# Patient Record
Sex: Female | Born: 1945 | Race: White | Hispanic: No | Marital: Married | Smoking: Never smoker
Health system: Southern US, Community
[De-identification: ages and names within clinical notes are randomized; demographics above are authoritative.]

## PROBLEM LIST (undated history)

## (undated) DIAGNOSIS — E7800 Pure hypercholesterolemia, unspecified: Secondary | ICD-10-CM

## (undated) DIAGNOSIS — I10 Essential (primary) hypertension: Secondary | ICD-10-CM

## (undated) DIAGNOSIS — M1990 Unspecified osteoarthritis, unspecified site: Secondary | ICD-10-CM

## (undated) DIAGNOSIS — S83209A Unspecified tear of unspecified meniscus, current injury, unspecified knee, initial encounter: Secondary | ICD-10-CM

## (undated) DIAGNOSIS — G500 Trigeminal neuralgia: Secondary | ICD-10-CM

## (undated) HISTORY — DX: Trigeminal neuralgia: G50.0

## (undated) HISTORY — DX: Pure hypercholesterolemia, unspecified: E78.00

## (undated) HISTORY — PX: COLONOSCOPY: SHX174

## (undated) HISTORY — DX: Unspecified osteoarthritis, unspecified site: M19.90

## (undated) HISTORY — PX: FOOT SURGERY: SHX648

## (undated) HISTORY — PX: MOUTH SURGERY: SHX715

## (undated) HISTORY — DX: Essential (primary) hypertension: I10

## (undated) HISTORY — PX: HERNIA REPAIR: SHX51

## (undated) HISTORY — DX: Unspecified tear of unspecified meniscus, current injury, unspecified knee, initial encounter: S83.209A

---

## 2002-11-14 ENCOUNTER — Ambulatory Visit (HOSPITAL_COMMUNITY): Admission: RE | Admit: 2002-11-14 | Discharge: 2002-11-14 | Payer: Self-pay | Admitting: General Surgery

## 2004-05-30 ENCOUNTER — Ambulatory Visit (HOSPITAL_COMMUNITY): Admission: RE | Admit: 2004-05-30 | Discharge: 2004-05-30 | Payer: Self-pay | Admitting: General Surgery

## 2004-08-10 ENCOUNTER — Ambulatory Visit (HOSPITAL_COMMUNITY): Admission: RE | Admit: 2004-08-10 | Discharge: 2004-08-10 | Payer: Self-pay | Admitting: Family Medicine

## 2005-02-10 ENCOUNTER — Ambulatory Visit (HOSPITAL_COMMUNITY): Admission: RE | Admit: 2005-02-10 | Discharge: 2005-02-10 | Payer: Self-pay | Admitting: Family Medicine

## 2005-06-23 ENCOUNTER — Ambulatory Visit (HOSPITAL_COMMUNITY): Admission: RE | Admit: 2005-06-23 | Discharge: 2005-06-23 | Payer: Self-pay | Admitting: General Surgery

## 2005-07-14 ENCOUNTER — Ambulatory Visit (HOSPITAL_COMMUNITY): Admission: RE | Admit: 2005-07-14 | Discharge: 2005-07-14 | Payer: Self-pay | Admitting: General Surgery

## 2005-07-24 ENCOUNTER — Encounter (HOSPITAL_COMMUNITY): Admission: RE | Admit: 2005-07-24 | Discharge: 2005-07-24 | Payer: Self-pay | Admitting: General Surgery

## 2006-02-12 ENCOUNTER — Ambulatory Visit (HOSPITAL_COMMUNITY): Admission: RE | Admit: 2006-02-12 | Discharge: 2006-02-12 | Payer: Self-pay | Admitting: Family Medicine

## 2006-04-03 ENCOUNTER — Ambulatory Visit (HOSPITAL_COMMUNITY): Admission: RE | Admit: 2006-04-03 | Discharge: 2006-04-03 | Payer: Self-pay | Admitting: Family Medicine

## 2006-09-19 ENCOUNTER — Ambulatory Visit (HOSPITAL_COMMUNITY): Admission: RE | Admit: 2006-09-19 | Discharge: 2006-09-19 | Payer: Self-pay | Admitting: Podiatry

## 2012-02-26 DIAGNOSIS — E785 Hyperlipidemia, unspecified: Secondary | ICD-10-CM | POA: Insufficient documentation

## 2014-03-26 DIAGNOSIS — M5430 Sciatica, unspecified side: Secondary | ICD-10-CM | POA: Insufficient documentation

## 2015-11-11 ENCOUNTER — Ambulatory Visit: Admit: 2015-11-11 | Payer: MEDICARE

## 2015-11-11 DIAGNOSIS — R519 Headache, unspecified: Secondary | ICD-10-CM

## 2015-11-11 NOTE — Unmapped (Signed)
PRN

## 2015-11-12 NOTE — Unmapped (Signed)
Paige Cross, 70 year old lady was seen in consultation today because of facial pain, left side, involving the upper maxillary region of 5 years duration.  The patient first developed dull, aching pain in the left upper jaw.  She was seen by a dentist and underwent several dental procedures.  She then underwent consultation at [facility] where they recommended a microvascular decompression, but she obtained additional consultations at [facility] at [facility] where both consultants recommended no surgical treatment.  The patient is healthy otherwise, but has had increasing depression and has tried numerous medications with no help.      PAST MEDICAL HISTORY:  Includes surgical procedures-  1. Herniorrhaphy.  2. Cesarean section.  3. 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  4. Multiple biopsies of breast nodules.     The remaining medical, family, social history and review of systems is included in her record.    PHYSICAL EXAMINATION:  Demonstrates a well-developed, healthy lady with blood pressure of 169/73, pulse 76 and BMI of 32.  She is cognitively intact and general physical examination shows by observation no evidence of pulmonary, cardiac, abdominal or extremity disorders.  The neurologic examination shows that the cranial nerves II through XII are intact.  She has no pain over touching the face or the area in the upper jaw where 2 teeth have been extracted and a crown has been placed over the molar anterior to the area of 2 edentulous members.  The patient has described her pain as constant, burning, aching in nature.  It never goes away unless she is distracted.  There is no loss of sensation in the area of the gum, face or anywhere on her body.  All cranial nerves are intact.  Coordination, reflexes, gait, strength and sensation of the body are intact.    The remaining neurologic examination is totally normal.    I reviewed an MRI who shows no evidence of tumor or vascular  compression.    We spent the remainder of the hour discussing nutrition, exercise and wellness, particularly meditation, spirituality and the impact of all of these disorders on her pain and recover.  She understands that a surgical procedure is inappropriate for her condition.  We discussed his in great detail and recommended discontinuation of Fentanyl and sleeping medications.    She will take these matters into consideration and give [place] a follow-up evaluation in 1 month.    Thank you for the opportunity to assist you in the care of his lady.  Best regards.    TIME SPENT:  I spent 1 hour face-to-face with the patient answering questions and discussing the diagnosis and treatment plan.  The patient voiced understanding and agreed with the plan.    JMT/nwc   [id number]

## 2015-11-16 ENCOUNTER — Encounter

## 2016-11-13 DIAGNOSIS — E118 Type 2 diabetes mellitus with unspecified complications: Secondary | ICD-10-CM | POA: Insufficient documentation

## 2016-11-13 DIAGNOSIS — K146 Glossodynia: Secondary | ICD-10-CM | POA: Insufficient documentation

## 2016-11-13 DIAGNOSIS — F419 Anxiety disorder, unspecified: Secondary | ICD-10-CM | POA: Insufficient documentation

## 2016-11-13 DIAGNOSIS — R208 Other disturbances of skin sensation: Secondary | ICD-10-CM | POA: Insufficient documentation

## 2018-01-22 DIAGNOSIS — G500 Trigeminal neuralgia: Secondary | ICD-10-CM | POA: Insufficient documentation

## 2019-05-20 ENCOUNTER — Ambulatory Visit (INDEPENDENT_AMBULATORY_CARE_PROVIDER_SITE_OTHER): Payer: Self-pay | Admitting: Internal Medicine

## 2019-05-28 ENCOUNTER — Encounter: Payer: Self-pay | Admitting: Orthopaedic Surgery

## 2019-05-28 ENCOUNTER — Ambulatory Visit (INDEPENDENT_AMBULATORY_CARE_PROVIDER_SITE_OTHER): Payer: Medicare Other | Admitting: Orthopaedic Surgery

## 2019-05-28 ENCOUNTER — Other Ambulatory Visit: Payer: Self-pay

## 2019-05-28 ENCOUNTER — Ambulatory Visit (INDEPENDENT_AMBULATORY_CARE_PROVIDER_SITE_OTHER): Payer: Medicare Other

## 2019-05-28 VITALS — BP 167/100 | HR 71 | Ht 59.0 in | Wt 137.0 lb

## 2019-05-28 DIAGNOSIS — M25561 Pain in right knee: Secondary | ICD-10-CM

## 2019-05-28 DIAGNOSIS — G8929 Other chronic pain: Secondary | ICD-10-CM | POA: Diagnosis not present

## 2019-05-28 NOTE — Progress Notes (Signed)
Subjective:    Patient ID: Wendy Barker, female    DOB: Feb 22, 1946, 73 y.o.   MRN: [id number]  HPI She has had pain increasing in the right knee since a fall about a month ago. Two years ago she had arthroscopy of the right knee and did well.  She fell about a year ago and had some pain for a short time.  Now she has pain and swelling of the right knee, no giving way.  It just does not feel right to her.  She has tried Tylenol, Advil, ice and rest.  She has no redness, no numbness.   Review of Systems  Constitutional: Positive for activity change.  Musculoskeletal: Positive for arthralgias, gait problem and joint swelling.  All other systems reviewed and are negative.  For Review of Systems, all other systems reviewed and are negative.  The following is a summary of the past history medically, past history surgically, known current medicines, social history and family history.  This information is gathered electronically by the computer from prior information and documentation.  I review this each visit and have found including this information at this point in the chart is beneficial and informative.   No past medical history on file.    No current outpatient medications on file prior to visit.   No current facility-administered medications on file prior to visit.     Social History   Socioeconomic History  . Marital status: Married    Spouse name: Not on file  . Number of children: Not on file  . Years of education: Not on file  . Highest education level: Not on file  Occupational History  . Not on file  Social Needs  . Financial resource strain: Not on file  . Food insecurity    Worry: Not on file    Inability: Not on file  . Transportation needs    Medical: Not on file    Non-medical: Not on file  Tobacco Use  . Smoking status: Never Smoker  . Smokeless tobacco: Never Used  Substance and Sexual Activity  . Alcohol use: Never    Frequency: Never  . Drug use:  Never  . Sexual activity: Not Currently  Lifestyle  . Physical activity    Days per week: Not on file    Minutes per session: Not on file  . Stress: Not on file  Relationships  . Social Herbalist on phone: Not on file    Gets together: Not on file    Attends religious service: Not on file    Active member of club or organization: Not on file    Attends meetings of clubs or organizations: Not on file    Relationship status: Not on file  . Intimate partner violence    Fear of current or ex partner: Not on file    Emotionally abused: Not on file    Physically abused: Not on file    Forced sexual activity: Not on file  Other Topics Concern  . Not on file  Social History Narrative  . Not on file    History of heart disease in the family.   BP (!) 167/100   Pulse 71   Ht 4\' 11"  (1.499 m)   Wt 137 lb (62.1 kg)   BMI 27.67 kg/m   Body mass index is 27.67 kg/m.     Objective:   Physical Exam Vitals signs reviewed.  Constitutional:  Appearance: She is well-developed.  HENT:     Head: Normocephalic and atraumatic.  Eyes:     Conjunctiva/sclera: Conjunctivae normal.     Pupils: Pupils are equal, round, and reactive to light.  Neck:     Musculoskeletal: Normal range of motion and neck supple.  Cardiovascular:     Rate and Rhythm: Normal rate and regular rhythm.  Pulmonary:     Effort: Pulmonary effort is normal.  Abdominal:     Palpations: Abdomen is soft.  Musculoskeletal:     Right knee: She exhibits decreased range of motion and swelling. Tenderness found. Lateral joint line tenderness noted.       Legs:  Skin:    General: Skin is warm and dry.  Neurological:     Mental Status: She is alert and oriented to person, place, and time.     Cranial Nerves: No cranial nerve deficit.     Motor: No abnormal muscle tone.     Coordination: Coordination normal.     Deep Tendon Reflexes: Reflexes are normal and symmetric. Reflexes normal.  Psychiatric:         Behavior: Behavior normal.        Thought Content: Thought content normal.        Judgment: Judgment normal.       X-rays were done of the right knee, reported separately.  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  No fracture or loose body.    Assessment & Plan:   Encounter Diagnosis  Name Primary?  . Chronic pain of right knee Yes   PROCEDURE NOTE:  The patient requests injections of the right knee , verbal consent was obtained.  The right knee was prepped appropriately after time out was performed.   Sterile technique was observed and injection of 1 cc of Depo-Medrol 40 mg with several cc's of plain xylocaine. Anesthesia was provided by ethyl chloride and a 20-gauge needle was used to inject the knee area. The injection was tolerated well.  A band aid dressing was applied.  The patient was advised to apply ice later today and tomorrow to the injection sight as needed.  Return in two weeks.  Consider MRI if not improved.  She should continue her water therapy.  Call if any problem.  Precautions discussed.   Electronically Signed Darreld McleanWayne Arelis Neumeier, MD 7/22/202010:42 AM

## 2019-05-29 ENCOUNTER — Telehealth: Payer: Self-pay | Admitting: Orthopaedic Surgery

## 2019-05-29 NOTE — Telephone Encounter (Signed)
I called patient to reschedule her appointment with Dr. Luna Glasgow in 2 weeks per AVS.  She said she wanted to wait and see how she does.  I told her that would send her the AVS so she has our phone number and she can decide when to call and reschedule

## 2019-06-12 ENCOUNTER — Ambulatory Visit (INDEPENDENT_AMBULATORY_CARE_PROVIDER_SITE_OTHER): Payer: Medicare Other | Admitting: Orthopaedic Surgery

## 2019-06-12 ENCOUNTER — Encounter: Payer: Self-pay | Admitting: Orthopaedic Surgery

## 2019-06-12 ENCOUNTER — Other Ambulatory Visit: Payer: Self-pay

## 2019-06-12 VITALS — Temp 97.3°F | Ht 59.0 in | Wt 137.0 lb

## 2019-06-12 DIAGNOSIS — G8929 Other chronic pain: Secondary | ICD-10-CM

## 2019-06-12 DIAGNOSIS — M47812 Spondylosis without myelopathy or radiculopathy, cervical region: Secondary | ICD-10-CM | POA: Insufficient documentation

## 2019-06-12 DIAGNOSIS — G4700 Insomnia, unspecified: Secondary | ICD-10-CM | POA: Insufficient documentation

## 2019-06-12 DIAGNOSIS — M25561 Pain in right knee: Secondary | ICD-10-CM | POA: Diagnosis not present

## 2019-06-12 DIAGNOSIS — F32A Depression, unspecified: Secondary | ICD-10-CM | POA: Insufficient documentation

## 2019-06-12 DIAGNOSIS — I10 Essential (primary) hypertension: Secondary | ICD-10-CM | POA: Insufficient documentation

## 2019-06-12 DIAGNOSIS — M5136 Other intervertebral disc degeneration, lumbar region: Secondary | ICD-10-CM | POA: Insufficient documentation

## 2019-06-12 DIAGNOSIS — F329 Major depressive disorder, single episode, unspecified: Secondary | ICD-10-CM | POA: Insufficient documentation

## 2019-06-12 NOTE — Progress Notes (Signed)
Patient ZO:XWRUEA:Wendy Barker, female DOB:03/13/1946, 73 y.o. VWU:[id number]  Chief Complaint  Patient presents with  . Knee Pain    right     HPI  Wendy Barker is a 73 y.o. female who has chronic pain of the right knee. She has not improved with the injection.  She has giving way.  She has swelling.  I will get MRI of the right knee.   Body mass index is 27.67 kg/m.  ROS  Review of Systems  Constitutional: Positive for activity change.  Musculoskeletal: Positive for arthralgias, gait problem and joint swelling.  All other systems reviewed and are negative.   All other systems reviewed and are negative.  The following is a summary of the past history medically, past history surgically, known current medicines, social history and family history.  This information is gathered electronically by the computer from prior information and documentation.  I review this each visit and have found including this information at this point in the chart is beneficial and informative.    History reviewed. No pertinent past medical history.  History reviewed. No pertinent surgical history.  Family History  Problem Relation Age of Onset  . High blood pressure Mother   . Diabetes Mother   . Cancer Mother   . High blood pressure Father   . Alcoholism Father     Social History Social History   Tobacco Use  . Smoking status: Never Smoker  . Smokeless tobacco: Never Used  Substance Use Topics  . Alcohol use: Never    Frequency: Never  . Drug use: Never    Allergies  Allergen Reactions  . Carbamazepine Nausea Only and Other (See Comments)    Other reaction(s): Dizziness Leg pain   . Fentanyl Nausea Only and Nausea And Vomiting    Other reaction(s): Dizziness  . Ketamine Diarrhea, Nausea Only and Other (See Comments)    Other reaction(s): Dizziness, Other (See Comments) Patient states she falls asleep and does not wake up for a while.  Patient states she falls asleep and does  not wake up for a while.    . Divalproex Sodium   . Amitriptyline Nausea Only  . Bupropion Other (See Comments)    "crying all the time" "BP up" "crying all the time" "BP up"   . Duloxetine Nausea Only  . Morphine Nausea Only    Pt states all opioids give her nausea  Pt states all opioids give her nausea  Pt states all opioids give her nausea    . Oxycodone-Acetaminophen Nausea Only    Pt states all opioids give her nausea  Pt states all opioids give her nausea  Pt states all opioids give her nausea  Pt states all opioids give her nausea  Pt states all opioids give her nausea    . Pregabalin Nausea Only  . Zonisamide Nausea Only    Current Outpatient Medications  Medication Sig Dispense Refill  . atenolol (TENORMIN) 50 MG tablet Take 50 mg by mouth daily.    . celecoxib (CELEBREX) 200 MG capsule Take 200 mg by mouth 2 (two) times daily.    Marland Kitchen. gabapentin (NEURONTIN) 300 MG capsule gabapentin 300 mg capsule    . triamterene-hydrochlorothiazide (DYAZIDE) 37.5-25 MG capsule Take 1 capsule by mouth daily.     No current facility-administered medications for this visit.      Physical Exam  Temperature (!) 97.3 F (36.3 C), height 4\' 11"  (1.499 m), weight 137 lb (62.1 kg).  Constitutional: overall normal  hygiene, normal nutrition, well developed, normal grooming, normal body habitus. Assistive device:none  Musculoskeletal: gait and station Limp right, muscle tone and strength are normal, no tremors or atrophy is present.  .  Neurological: coordination overall normal.  Deep tendon reflex/nerve stretch intact.  Sensation normal.  Cranial nerves II-XII intact.   Skin:   Normal overall no scars, lesions, ulcers or rashes. No psoriasis.  Psychiatric: Alert and oriented x 3.  Recent memory intact, remote memory unclear.  Normal mood and affect. Well groomed.  Good eye contact.  Cardiovascular: overall no swelling, no varicosities, no edema bilaterally, normal temperatures of the  legs and arms, no clubbing, cyanosis and good capillary refill.  Lymphatic: palpation is normal.  Right knee has pain, effusion, crepitus, ROM 0 to 105, pain medially, positive medial [place], NV intact.  Limp to the right.  She has no distal edema, no redness.  All other systems reviewed and are negative   The patient has been educated about the nature of the problem(s) and counseled on treatment options.  The patient appeared to understand what I have discussed and is in agreement with it.  Encounter Diagnosis  Name Primary?  . Chronic pain of right knee Yes    PLAN Call if any problems.  Precautions discussed.  Continue current medications.   Return to clinic see after MRI of the right knee   Electronically Signed Sanjuana Kava, MD 8/6/202011:49 AM

## 2019-06-12 NOTE — Addendum Note (Signed)
Addended by: Derek Mound A on: 06/12/2019 02:48 PM   Modules accepted: Orders

## 2019-06-17 ENCOUNTER — Telehealth: Payer: Self-pay | Admitting: Orthopaedic Surgery

## 2019-06-17 NOTE — Telephone Encounter (Signed)
Call received from [facility], per [place], states needs MRI order as soon as possible or patient will need to be re-scheduled. [phone] / [phone]

## 2019-06-17 NOTE — Telephone Encounter (Signed)
Inez Catalina had Dr Luna Glasgow sign it and it was faxed.

## 2019-06-17 NOTE — Addendum Note (Signed)
Addended by: Derek Mound A on: 06/17/2019 10:03 AM   Modules accepted: Orders

## 2019-06-17 NOTE — Addendum Note (Signed)
Addended by: Derek Mound A on: 06/17/2019 09:59 AM   Modules accepted: Orders

## 2019-06-18 ENCOUNTER — Encounter: Payer: Self-pay | Admitting: Orthopaedic Surgery

## 2019-06-24 ENCOUNTER — Ambulatory Visit (INDEPENDENT_AMBULATORY_CARE_PROVIDER_SITE_OTHER): Payer: Medicare Other | Admitting: Orthopaedic Surgery

## 2019-06-24 ENCOUNTER — Encounter: Payer: Self-pay | Admitting: Orthopaedic Surgery

## 2019-06-24 ENCOUNTER — Other Ambulatory Visit: Payer: Self-pay

## 2019-06-24 VITALS — BP 188/98 | HR 66 | Temp 97.7°F | Ht 59.0 in | Wt 136.0 lb

## 2019-06-24 DIAGNOSIS — G8929 Other chronic pain: Secondary | ICD-10-CM

## 2019-06-24 DIAGNOSIS — M25561 Pain in right knee: Secondary | ICD-10-CM | POA: Diagnosis not present

## 2019-06-24 NOTE — Progress Notes (Signed)
Patient ON:GEXBMW:Wendy Barker, female DOB:06/05/1946, 73 y.o. [id number]  Chief Complaint  Patient presents with  . Knee Pain    Chronic pain of the right knee.    HPI  Wendy Barker is a 73 y.o. female who has pain of the right knee.  [facility] showing tricompartmental degenerative disease, no internal derangement.  I have explained the findings to her.  I have recommended consideration of a total knee.  I would wait because of COVID-19.  She asked appropriate questions.   Body mass index is 27.47 kg/m.  ROS  Review of Systems  Constitutional: Positive for activity change.  Musculoskeletal: Positive for arthralgias, gait problem and joint swelling.  All other systems reviewed and are negative.   All other systems reviewed and are negative.  The following is a summary of the past history medically, past history surgically, known current medicines, social history and family history.  This information is gathered electronically by the computer from prior information and documentation.  I review this each visit and have found including this information at this point in the chart is beneficial and informative.    History reviewed. No pertinent past medical history.  History reviewed. No pertinent surgical history.  Family History  Problem Relation Age of Onset  . High blood pressure Mother   . Diabetes Mother   . Cancer Mother   . High blood pressure Father   . Alcoholism Father     Social History Social History   Tobacco Use  . Smoking status: Never Smoker  . Smokeless tobacco: Never Used  Substance Use Topics  . Alcohol use: Never    Frequency: Never  . Drug use: Never    Allergies  Allergen Reactions  . Carbamazepine Nausea Only and Other (See Comments)    Other reaction(s): Dizziness Leg pain   . Fentanyl Nausea Only and Nausea And Vomiting    Other reaction(s): Dizziness  . Ketamine Diarrhea, Nausea Only and Other (See Comments)    Other reaction(s): Dizziness, Other (See Comments) Patient states she falls asleep and does not wake up for a while.  Patient states she falls asleep and does not wake up for a while.    . Divalproex Sodium   . Amitriptyline Nausea Only  . Bupropion Other (See Comments)    "crying all the time" "BP up" "crying all the time" "BP up"   . Duloxetine Nausea Only  . Morphine Nausea Only    Pt states all opioids give her nausea  Pt states all opioids give her nausea  Pt states all opioids give her nausea    . Oxycodone-Acetaminophen Nausea Only    Pt states all opioids give her nausea  Pt states all opioids give her nausea  Pt states all opioids give her nausea  Pt states all opioids give her nausea  Pt states all opioids give her nausea    . Pregabalin Nausea Only  . Zonisamide Nausea Only    Current Outpatient Medications  Medication Sig Dispense Refill  . atenolol (TENORMIN) 50 MG tablet Take 50 mg by mouth daily.    . celecoxib (CELEBREX) 200 MG capsule Take 200 mg by mouth 2 (two) times daily.    Marland Kitchen. gabapentin (NEURONTIN) 300 MG capsule gabapentin 300 mg capsule    . triamterene-hydrochlorothiazide (DYAZIDE) 37.5-25 MG capsule Take 1 capsule by mouth daily.     No current facility-administered medications for this visit.      Physical Exam  Blood pressure (!) 188/98, pulse 66, temperature 97.7 F (36.5 C), height 4\' 11"  (1.499 m), weight 136 lb (61.7 kg).  Constitutional: overall normal hygiene, normal nutrition, well developed, normal grooming, normal body habitus. Assistive device:none  Musculoskeletal: gait and station Limp right, muscle tone and strength are normal, no tremors or atrophy is present.  .  Neurological: coordination overall normal.  Deep tendon reflex/nerve stretch intact.  Sensation normal.  Cranial nerves II-XII intact.   Skin:   Normal overall no scars, lesions, ulcers or rashes. No psoriasis.  Psychiatric: Alert and oriented x 3.  Recent memory  intact, remote memory unclear.  Normal mood and affect. Well groomed.  Good eye contact.  Cardiovascular: overall no swelling, no varicosities, no edema bilaterally, normal temperatures of the legs and arms, no clubbing, cyanosis and good capillary refill.  Right knee with effusion, crepitus, ROM 0 to 105, NV intact.  Lymphatic: palpation is normal.  All other systems reviewed and are negative   The patient has been educated about the nature of the problem(s) and counseled on treatment options.  The patient appeared to understand what I have discussed and is in agreement with it.  Encounter Diagnosis  Name Primary?  . Chronic pain of right knee Yes    PLAN Call if any problems.  Precautions discussed.  Continue current medications.   Return to clinic PRN   Electronically Signed Sanjuana Kava, MD 8/18/202012:00 PM

## 2020-07-13 ENCOUNTER — Other Ambulatory Visit: Payer: Self-pay | Admitting: Oral and Maxillofacial Surgery

## 2020-07-13 ENCOUNTER — Other Ambulatory Visit: Payer: Self-pay

## 2020-07-13 DIAGNOSIS — K1120 Sialoadenitis, unspecified: Secondary | ICD-10-CM

## 2020-07-27 ENCOUNTER — Other Ambulatory Visit (HOSPITAL_COMMUNITY): Payer: Self-pay

## 2020-07-27 DIAGNOSIS — K1120 Sialoadenitis, unspecified: Secondary | ICD-10-CM

## 2020-07-28 ENCOUNTER — Ambulatory Visit (HOSPITAL_COMMUNITY): Payer: Medicare Other

## 2020-08-17 ENCOUNTER — Ambulatory Visit (HOSPITAL_COMMUNITY)
Admission: RE | Admit: 2020-08-17 | Discharge: 2020-08-17 | Disposition: A | Discharge status: Home / Self Care | Payer: Medicare Other | Source: Ambulatory Visit | Attending: Oral and Maxillofacial Surgery | Admitting: Oral and Maxillofacial Surgery

## 2020-08-17 ENCOUNTER — Other Ambulatory Visit: Payer: Self-pay

## 2020-08-17 DIAGNOSIS — K1120 Sialoadenitis, unspecified: Secondary | ICD-10-CM | POA: Diagnosis not present

## 2020-08-17 LAB — POCT I-STAT CREATININE: Creatinine, Ser: 0.6 mg/dL (ref 0.44–1.00)

## 2020-08-17 MED ORDER — IOHEXOL 300 MG/ML  SOLN
75.0000 mL | Freq: Once | INTRAMUSCULAR | Status: AC | PRN
  Administered 2020-08-17: 75 mL via INTRAVENOUS

## 2020-09-08 ENCOUNTER — Encounter: Payer: Self-pay | Admitting: Neurology

## 2020-09-08 ENCOUNTER — Encounter: Payer: Self-pay | Admitting: *Deleted

## 2020-09-08 ENCOUNTER — Telehealth: Payer: Self-pay | Admitting: *Deleted

## 2020-09-08 ENCOUNTER — Ambulatory Visit (INDEPENDENT_AMBULATORY_CARE_PROVIDER_SITE_OTHER): Payer: Medicare Other | Admitting: Neurology

## 2020-09-08 ENCOUNTER — Other Ambulatory Visit: Payer: Self-pay

## 2020-09-08 VITALS — BP 150/86 | HR 69 | Ht <= 58 in | Wt 140.0 lb

## 2020-09-08 DIAGNOSIS — G500 Trigeminal neuralgia: Secondary | ICD-10-CM | POA: Diagnosis not present

## 2020-09-08 MED ORDER — LAMOTRIGINE 25 MG PO TABS: ORAL_TABLET | ORAL | 2 refills | Status: AC

## 2020-09-08 MED ORDER — LAMOTRIGINE 25 MG PO TABS: ORAL_TABLET | ORAL | 0 refills | Status: DC

## 2020-09-08 NOTE — Progress Notes (Signed)
Per notes from [facility].

## 2020-09-08 NOTE — Telephone Encounter (Signed)
Lamotrigine Rx ordered, printed, and signed by Dr Lucia Gaskins. I faxed this to [facility]. Received a receipt of confirmation.

## 2020-09-08 NOTE — Progress Notes (Signed)
[facility]    Provider:  Dr Lucia Gaskins Requesting Provider: Suzan Slick, MD Primary Care Provider:  Suzan Slick, MD  CC:  Trigeminal neuralgia  HPI:  Wendy Barker is a 74 y.o. female here as requested by Suzan Slick, MD for trigeminal neuralgia. She was seeing a neurologist at [facility], he left the practice. She was up to 3200mg  of gabapentin and wasn't getting relief. She is always searching for words and thinks it is the Gabapentin. TGN is on the left side, feels like someone punched her in the face, continuous, started in the teeth and they pulled teeth and it did not help. She has pain in the ear, radiates to the jaw, she has been to [facility] and discussed surgery, she went to [facility] as well, she has been to multiple specialists and surgery is not an option. She discussed decompression and gamma knife. She has had imaging and they have not seen vascular loop, been to [place] and [facility], seen multiple neurosurgeons and neurologists. None of them have recommended surgery. She has had ketamine infusion and nerve blocks done and none of them help. She has discussed every possibility with multiple physicians. She hs also tried multiple medications Tegretol, gabapentin, trileptal. She cannot take opioids. She takes 300mg  Gabapentin twice daily. No other focal neurologic deficits, associated symptoms, inciting events or modifiable factors.  Tegretol, gabapentin, trileptal, lyrica, cymbalta, baclofen, naltrexone, klonopin, fentanyl and other opioids, doxepin, botox, nerve blocks, acupuncture, xanax, vicodin, elavil, test for spinal cord stimulator and it did not help, gabatril, butrans patch, nortriptyline, tramadol  Reviewed notes, labs and imaging from outside physicians, which showed: see above  Review of Systems: Patient complains of symptoms per HPI as well as the following symptoms facial pain. Pertinent negatives and positives per HPI. All others  negative.   Social History   Socioeconomic History  . Marital status: Married    Spouse name: Not on file  . Number of children: 2  . Years of education: Not on file  . Highest education level: High school graduate  Occupational History  . Not on file  Tobacco Use  . Smoking status: Never Smoker  . Smokeless tobacco: Never Used  Substance and Sexual Activity  . Alcohol use: Never  . Drug use: Never  . Sexual activity: Not Currently  Other Topics Concern  . Not on file  Social History Narrative   Lives at home with spouse   Caffeine: never   Social Determinants of Health   Financial Resource Strain:   . Difficulty of Paying Living Expenses: Not on file  Food Insecurity:   . Worried About [place] in the Last Year: Not on file  . Ran Out of Food in the Last Year: Not on file  Transportation Needs:   . Lack of Transportation (Medical): Not on file  . Lack of Transportation (Non-Medical): Not on file  Physical Activity:   . Days of Exercise per Week: Not on file  . Minutes of Exercise per Session: Not on file  Stress:   . Feeling of Stress : Not on file  Social Connections:   . Frequency of Communication with Friends and Family: Not on file  . Frequency of Social Gatherings with Friends and Family: Not on file  . Attends Religious Services: Not on file  . Active Member of Clubs or Organizations: Not on file  . Attends Meetings: Not on file  . Marital Status: Not on  file  Intimate Partner Violence:   . Fear of Current or Ex-Partner: Not on file  . Emotionally Abused: Not on file  . Physically Abused: Not on file  . Sexually Abused: Not on file    Family History  Problem Relation Age of Onset  . High blood pressure Mother   . Diabetes Mother   . Cancer Mother   . High blood pressure Father   . Alcoholism Father   . Heart Problems Father   . High blood pressure Other   . Breast cancer Other        12 maternal family members      Past Medical History:  Diagnosis Date  . Arthritis   . High cholesterol   . Hypertension    per [facility]  . Torn meniscus   . Trigeminal neuralgia    per notes [facility]     Patient Active Problem List   Diagnosis Date Noted  . Benign essential hypertension 06/12/2019  . 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 DDD (degenerative disc disease), lumbar 06/12/2019  . Depressive disorder 06/12/2019  . Insomnia 06/12/2019  . Trigeminal neuralgia of left side of face 01/22/2018  . Burning tongue syndrome 11/13/2016  . Chronic anxiety 11/13/2016  . Controlled type 2 diabetes mellitus with complication, with long-term current use of insulin (HCC) 11/13/2016  . Dysesthesia 11/13/2016  . Sciatica 03/26/2014  . Hyperlipidemia, unspecified 02/26/2012    Past Surgical History:  Procedure Laterality Date  . CESAREAN SECTION     x2  . COLONOSCOPY     per [facility]  . FOOT SURGERY     x4  . HERNIA REPAIR     x4  . MOUTH SURGERY     per [facility]     Current Outpatient Medications  Medication Sig Dispense Refill  . acetaminophen (TYLENOL) 500 MG tablet Take 1,000 mg by mouth in the morning, at noon, and at bedtime.    Marland Kitchen atenolol (TENORMIN) 50 MG tablet Take 50 mg by mouth daily.    Marland Kitchen atorvastatin (LIPITOR) 20 MG tablet Take 20 mg by mouth daily.    Marland Kitchen gabapentin (NEURONTIN) 300 MG capsule Take 300 mg by mouth 2 (two) times daily.     . melatonin 1 MG TABS tablet Take 2 mg by mouth at bedtime.    . triamterene-hydrochlorothiazide (DYAZIDE) 37.5-25 MG capsule Take 1 capsule by mouth daily.    Marland Kitchen lamoTRIgine (LAMICTAL) 25 MG tablet Week 1: 25mg  (one pill) at night  Week 2: 25mg  (one pill) twice daily Week 3: 50mg  (two pills) night and 25mg  morning  Week 4: 50mg  (two pills) twice daily  Week 5: 75mg  (three pills) night and 50 mg (two pills)  morning  Week 6: 75mg  (three pills) twice daily Week 7: 100mg  (four pills) night and 75mg  (three pills) morning Week 8: four pills (100mg ) twice daily 240 tablet 2   No current facility-administered medications for this visit.    Allergies as of 09/08/2020 - Review Complete 09/08/2020  Allergen Reaction Noted  . Carbamazepine Nausea Only and Other (See Comments) 08/24/2014  . Fentanyl Nausea Only and Nausea And Vomiting 11/26/2014  . Ketamine Diarrhea, Nausea Only, and Other (See Comments) 11/07/2017  . Divalproex sodium  06/12/2019  . Amitriptyline Nausea Only 02/04/2014  . Bupropion Other (See Comments) 07/25/2017  . Duloxetine Nausea Only 07/25/2017  . Morphine Nausea  Only 11/27/2012  . Oxycodone-acetaminophen Nausea Only 11/27/2012  . Pregabalin Nausea Only 07/25/2017  . Zonisamide Nausea Only 07/25/2017    Vitals: BP (!) 150/86 (BP Location: Left Arm, Patient Position: Sitting)   Pulse 69   Ht 4\' 10"  (1.473 m)   Wt 140 lb (63.5 kg)   BMI 29.26 kg/m  Last Weight:  Wt Readings from Last 1 Encounters:  09/08/20 140 lb (63.5 kg)   Last Height:   Ht Readings from Last 1 Encounters:  09/08/20 4\' 10"  (1.473 m)     Physical exam: Exam: Gen: NAD, conversant, well nourised, overweight, well groomed                     CV: RRR, no MRG. No Carotid Bruits. No peripheral edema, warm, nontender Eyes: Conjunctivae clear without exudates or hemorrhage  Neuro: Detailed Neurologic Exam  Speech:    Speech is normal; fluent and spontaneous with normal comprehension.  Cognition:    The patient is oriented to person, place, and time;     recent and remote memory intact;     language fluent;     normal attention, concentration,     fund of knowledge Cranial Nerves:    The pupils are equal, round, and reactive to light. Pupils too small to visualize fundi. Visual fields are full to finger confrontation. Extraocular movements are intact. Trigeminal sensation is intact and the  muscles of mastication are normal. The face is symmetric. The palate elevates in the midline. Hearing intact. Voice is normal. Shoulder shrug is normal. The tongue has normal motion without fasciculations.   Coordination:    No dysmetria or ataxia   Gait:    No ataxia, no shuffling  Motor Observation:    No asymmetry, no atrophy, and no involuntary movements noted. Tone:    Normal muscle tone.    Posture:    Posture is normal. normal erect    Strength:    Strength is V/V in the upper and lower limbs.      Sensation: intact to LT     Reflex Exam:  DTR's:    Deep tendon reflexes in the upper and lower extremities are symmetrical bilaterally.   Toes:    The toes are downgoing bilaterally.   Clonus:    Clonus is absent.    Assessment/Plan:  Patient with atypical facial pain in the left trigeminal V3 region. She has been to multiple neurologists and neurosurgeons including [facility], 13/03/21 and seen specialists in [place] and [address], she has discussed every possibility with physicians including decompression(never found a vascular loop on imaging), RFA and other procedures, nerve blocks, multiple medications and nothing works. She declines repeat imaging. Encouraged her to consider RFA of the trigeminal nerve would reocmmend [facility] she declines for now. Will try medical management. At this point if Lamictal doesn't work we can try the new CGRP medications, not indicated in TGN however she has been through most 1st,2nd and 3rd-line agents for TGN  Start Lamictal for refractory atypical trigeminal neuralgia  Week 1: 25mg  (one pill) at night  Week 2: 25mg  (one pill) twice daily Week 3: 50mg  (two pills) night and 25mg  morning  Week 4: 50mg  (two pills) twice daily  Week 5: 75mg  (three pills) night and 50 mg (two pills) morning  Week 6: 75mg  (three pills) twice daily Week 7: 100mg  (four pills) night and 75mg  (three pills) morning Week 8: four pills (100mg ) twice  daily   Tried: Tegretol, gabapentin,  trileptal, lyrica, cymbalta, baclofen, naltrexone, klonopin, fentanyl and other opioids, doxepin, botox, nerve blocks, acupuncture, xanax, vicodin, elavil, test for spinal cord stimulator and it did not help, gabatril, butrans patch, nortriptyline, tramadol   No orders of the defined types were placed in this encounter.  Meds ordered this encounter  Medications  . DISCONTD: lamoTRIgine (LAMICTAL) 25 MG tablet    Sig: Week 1: 25mg  (one pill) at night  Week 2: 25mg  (one pill) twice daily Week 3: 50mg  (two pills) night and 25mg  morning  Week 4: 50mg  (two pills) twice daily  Week 5: 75mg  (three pills) night and 50 mg (two pills) morning  Week 6: 75mg  (three pills) twice daily Week 7: 100mg  (four pills) night and 75mg  (three pills) morning Week 8: four pills (100mg ) twice daily    Dispense:  240 tablet    Refill:  0  . lamoTRIgine (LAMICTAL) 25 MG tablet    Sig: Week 1: 25mg  (one pill) at night  Week 2: 25mg  (one pill) twice daily Week 3: 50mg  (two pills) night and 25mg  morning  Week 4: 50mg  (two pills) twice daily  Week 5: 75mg  (three pills) night and 50 mg (two pills) morning  Week 6: 75mg  (three pills) twice daily Week 7: 100mg  (four pills) night and 75mg  (three pills) morning Week 8: four pills (100mg ) twice daily    Dispense:  240 tablet    Refill:  2    Cc: Rucker, Magdalen SpatzAlethea Y, MD,  Suzan Slickucker, Alethea Y, MD  Naomie DeanAntonia Samual Beals, MD  [facility] [address] [place], [place] [id number]  Phone [phone] Fax [phone]  I spent over 60 minutes of face-to-face and non-face-to-face time with patient on the  1. Trigeminal neuralgia of left side of face    diagnosis.  This included previsit chart review, lab review, study review, order entry, electronic health record documentation, patient education on the different diagnostic and therapeutic options, counseling and coordination of care, risks and benefits of  management, compliance, or risk factor reduction

## 2020-09-08 NOTE — Patient Instructions (Signed)
Week 1: 25mg  (one pill) at night  Week 2: 25mg  (one pill) twice daily Week 3: 50mg  (two pills) night and 25mg  morning  Week 4: 50mg  (two pills) twice daily  Week 5: 75mg  (three pills) night and 50 mg (two pills) morning  Week 6: 75mg  (three pills) twice daily Week 7: 100mg  (four pills) night and 75mg  (three pills) morning Week 8: four pills (100mg ) twice daily  Lamotrigine tablets What is this medicine? LAMOTRIGINE (la MOE ) is used to control seizures in adults and children with epilepsy and Lennox-Gastaut syndrome. It is also used in adults to treat bipolar disorder. This medicine may be used for other purposes; ask your health care provider or pharmacist if you have questions. COMMON BRAND NAME(S): Lamictal, Subvenite What should I tell my health care provider before I take this medicine? They need to know if you have any of these conditions:  aseptic meningitis during prior use of lamotrigine  depression  folate deficiency  kidney disease  liver disease  suicidal thoughts, plans, or attempt; a previous suicide attempt by you or a family member  an unusual or allergic reaction to lamotrigine or other seizure medications, other medicines, foods, dyes, or preservatives  pregnant or trying to get pregnant  breast-feeding How should I use this medicine? Take this medicine by mouth with a glass of water. Follow the directions on the prescription label. Do not chew these tablets. If this medicine upsets your stomach, take it with food or milk. Take your doses at regular intervals. Do not take your medicine more often than directed. A special MedGuide will be given to you by the pharmacist with each new prescription and refill. Be sure to read this information carefully each time. Talk to your pediatrician regarding the use of this medicine in children. While this drug may be prescribed for children as young as 2 years for selected conditions, precautions do  apply. Overdosage: If you think you have taken too much of this medicine contact a poison control center or emergency room at once. NOTE: This medicine is only for you. Do not share this medicine with others. What if I miss a dose? If you miss a dose, take it as soon as you can. If it is almost time for your next dose, take only that dose. Do not take double or extra doses. What may interact with this medicine?  atazanavir  carbamazepine  female hormones, including contraceptive or birth control pills  lopinavir  methotrexate  phenobarbital  phenytoin  primidone  pyrimethamine  rifampin  ritonavir  trimethoprim  valproic acid This list may not describe all possible interactions. Give your health care provider a list of all the medicines, herbs, non-prescription drugs, or dietary supplements you use. Also tell them if you smoke, drink alcohol, or use illegal drugs. Some items may interact with your medicine. What should I watch for while using this medicine? Visit your doctor or health care provider for regular checks on your progress. If you take this medicine for seizures, wear a Medic Alert bracelet or necklace. Carry an identification card with information about your condition, medicines, and doctor or health care provider. It is important to take this medicine exactly as directed. When first starting treatment, your dose will need to be adjusted slowly. It may take weeks or months before your dose is stable. You should contact your doctor or health care provider if your seizures get worse or if you have any new types of seizures. Do not  stop taking this medicine unless instructed by your doctor or health care provider. Stopping your medicine suddenly can increase your seizures or their severity. This medicine may cause serious skin reactions. They can happen weeks to months after starting the medicine. Contact your health care provider right away if you notice fevers or  flu-like symptoms with a rash. The rash may be red or purple and then turn into blisters or peeling of the skin. Or, you might notice a red rash with swelling of the face, lips or lymph nodes in your neck or under your arms. You may get drowsy, dizzy, or have blurred vision. Do not drive, use machinery, or do anything that needs mental alertness until you know how this medicine affects you. To reduce dizzy or fainting spells, do not sit or stand up quickly, especially if you are an older patient. Alcohol can increase drowsiness and dizziness. Avoid alcoholic drinks. If you are taking this medicine for bipolar disorder, it is important to report any changes in your mood to your doctor or health care provider. If your condition gets worse, you get mentally depressed, feel very hyperactive or manic, have difficulty sleeping, or have thoughts of hurting yourself or committing suicide, you need to get help from your health care provider right away. If you are a caregiver for someone taking this medicine for bipolar disorder, you should also report these behavioral changes right away. The use of this medicine may increase the chance of suicidal thoughts or actions. Pay special attention to how you are responding while on this medicine. Your mouth may get dry. Chewing sugarless gum or sucking hard candy, and drinking plenty of water may help. Contact your doctor if the problem does not go away or is severe. Women who become pregnant while using this medicine may enroll in the [place] Antiepileptic Drug Pregnancy Registry by calling [id number]. This registry collects information about the safety of antiepileptic drug use during pregnancy. This medicine may cause a decrease in folic acid. You should make sure that you get enough folic acid while you are taking this medicine. Discuss the foods you eat and the vitamins you take with your health care provider. What side effects may I notice from receiving this  medicine? Side effects that you should report to your doctor or health care professional as soon as possible:  allergic reactions like skin rash, itching or hives, swelling of the face, lips, or tongue  changes in vision  depressed mood  elevated mood, decreased need for sleep, racing thoughts, impulsive behavior  loss of balance or coordination  mouth sores  rash, fever, and swollen lymph nodes  redness, blistering, peeling or loosening of the skin, including inside the mouth  right upper belly pain  seizures  severe muscle pain  signs and symptoms of aseptic meningitis such as stiff neck and sensitivity to light, headache, drowsiness, fever, nausea, vomiting, rash  signs of infection - fever or chills, cough, sore throat, pain or difficulty passing urine  suicidal thoughts or other mood changes  swollen lymph nodes  trouble walking  unusual bruising or bleeding  unusually weak or tired  yellowing of the eyes or skin Side effects that usually do not require medical attention (report to your doctor or health care professional if they continue or are bothersome):  diarrhea  dizziness  dry mouth  stuffy nose  tiredness  tremors  trouble sleeping This list may not describe all possible side effects. Call your doctor for medical advice  about side effects. You may report side effects to FDA at 1-800-FDA-1088. Where should I keep my medicine? Keep out of reach of children. Store at room temperature between 15 and 30 degrees C (59 and 86 degrees F). Throw away any unused medicine after the expiration date. NOTE: This sheet is a summary. It may not cover all possible information. If you have questions about this medicine, talk to your doctor, pharmacist, or health care provider.  2020 Elsevier/Gold Standard (2019-01-24 15:03:40)

## 2020-12-13 ENCOUNTER — Ambulatory Visit: Payer: Medicare Other | Admitting: Neurology

## 2021-10-10 IMAGING — CT CT MAXILLOFACIAL W/ CM
3 of 4 series · 15 of 47 positions shown, 18 images · IV contrast (omnipaque)
Comparison: None.

CLINICAL DATA: Soreness of the inside of the left cheek for the
last 2-3 months.

EXAM:
CT MAXILLOFACIAL WITH CONTRAST
TECHNIQUE: Multidetector CT imaging of the maxillofacial structures was
performed with intravenous contrast. Multiplanar CT image
reconstructions were also generated.
CONTRAST:  75mL OMNIPAQUE IOHEXOL 300 MG/ML  SOLN

[Series 3: max soft · axial · 0.34mm/px · z∈[-85,+59]mm · 10 of 84 slices shown, 13 images]
[im 6/84  brain]
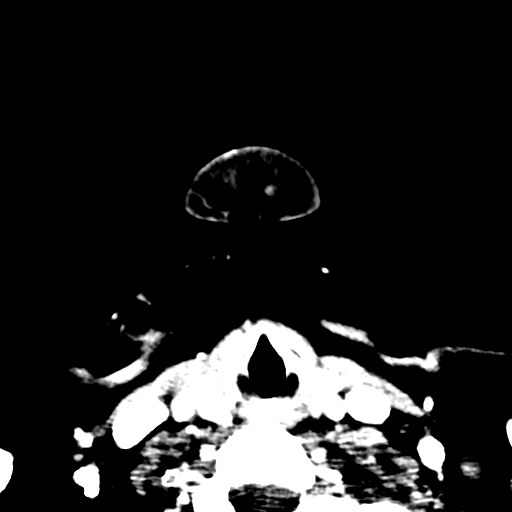
[im 6/84  bone]
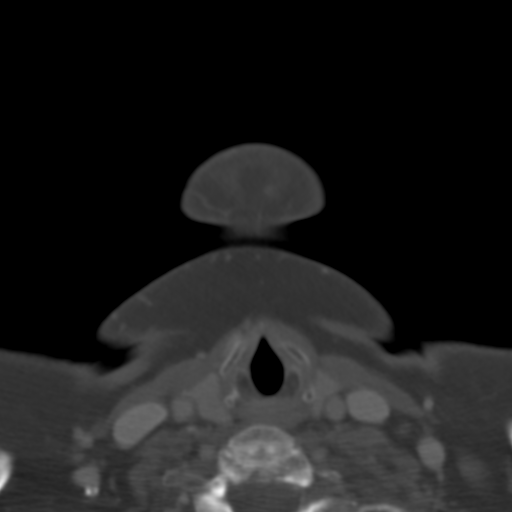
[im 15/84  bone]
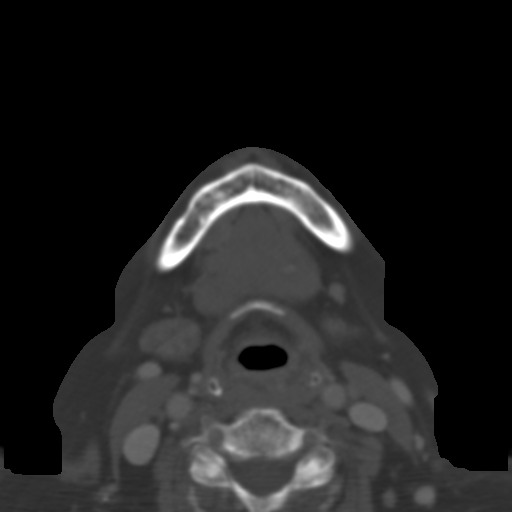
[im 23/84  bone]
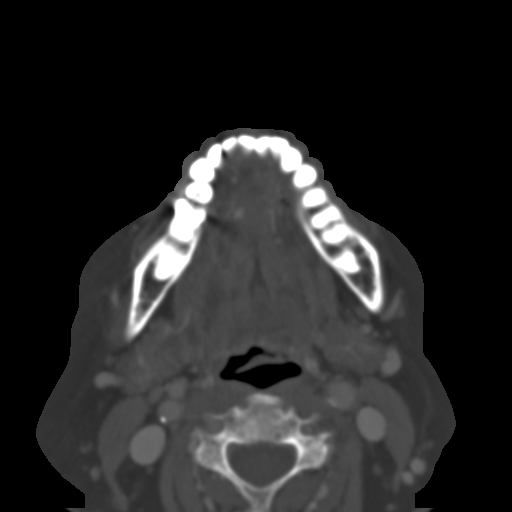
[im 29/84  bone]
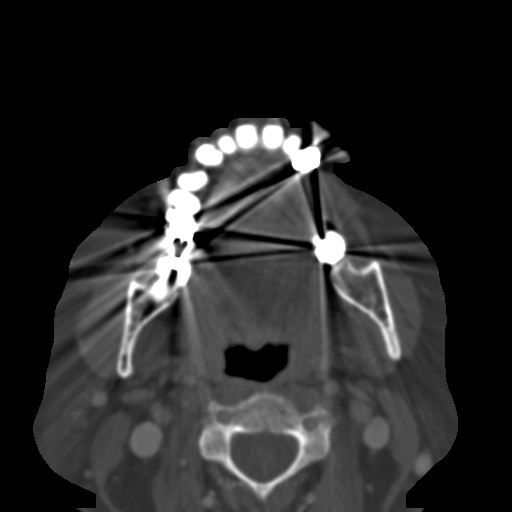
[im 38/84  brain]
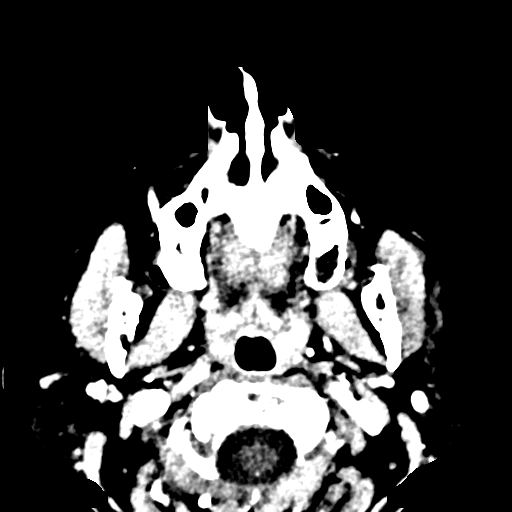
[im 38/84  bone]
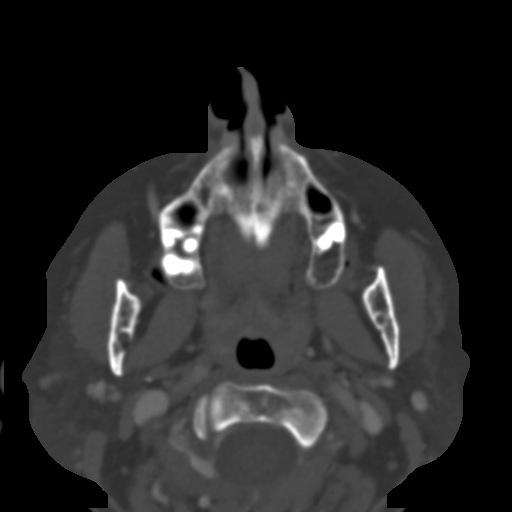
[im 46/84  bone]
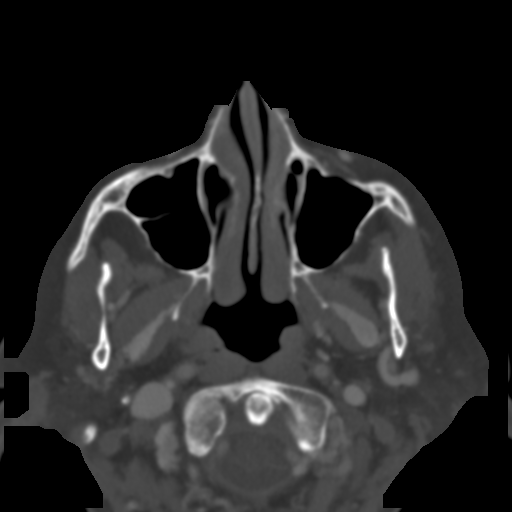
[im 55/84  bone]
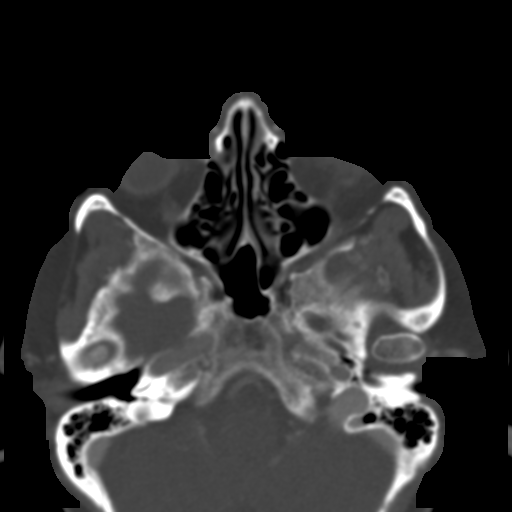
[im 63/84  bone]
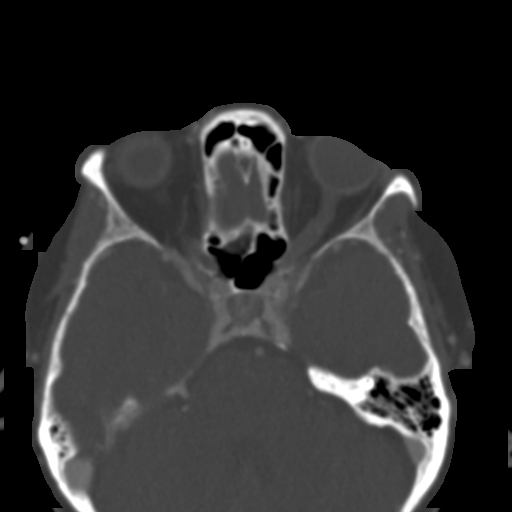
[im 69/84  brain]
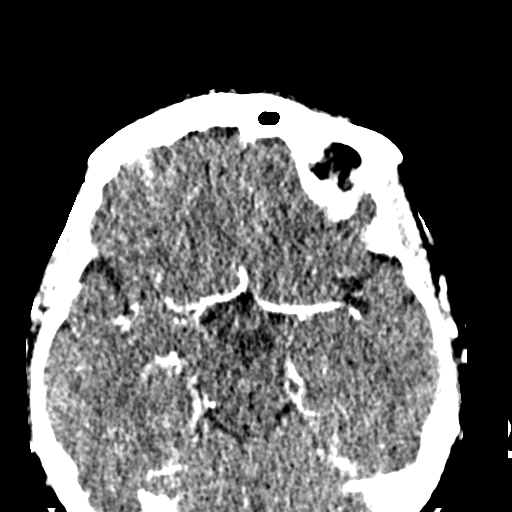
[im 69/84  bone]
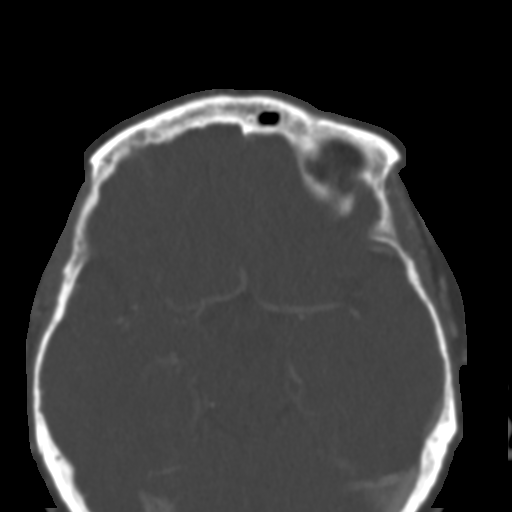
[im 78/84  bone]
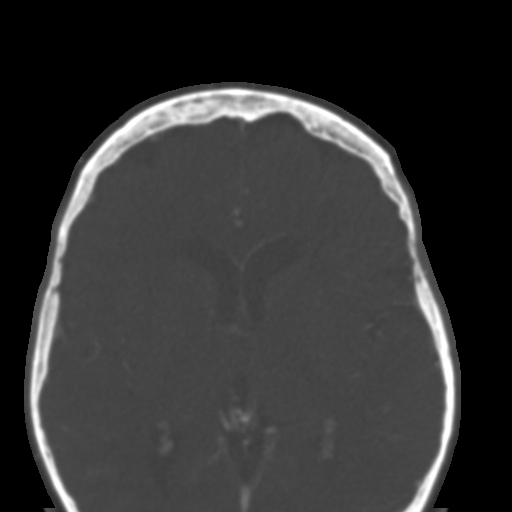

[Series 6: sagittal soft · sagittal · 0.35mm/px · 3 of 97 slices shown]
[im 33/97  bone]
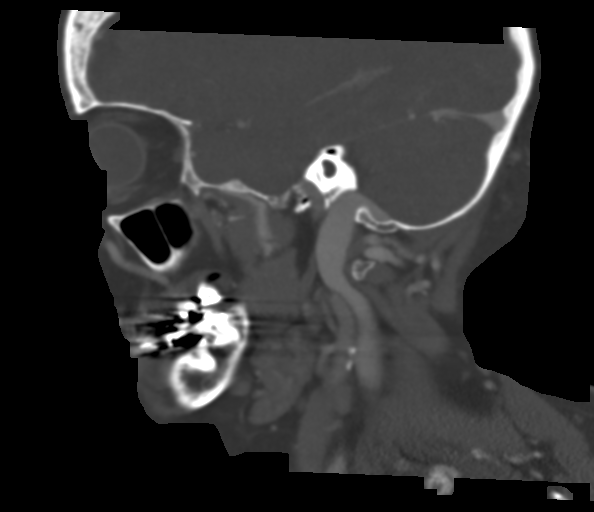
[im 49/97  bone]
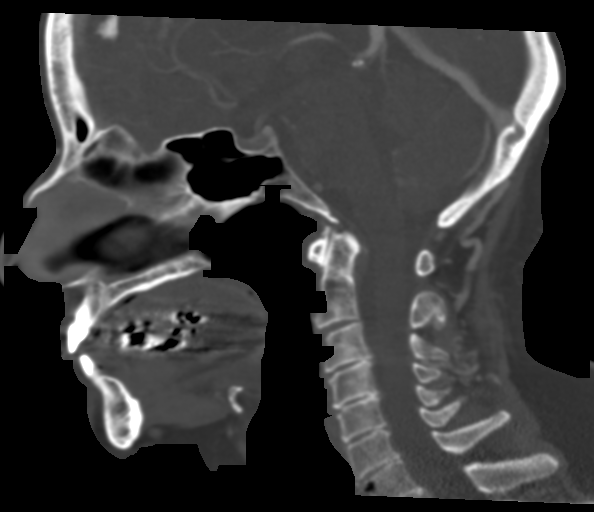
[im 65/97  bone]
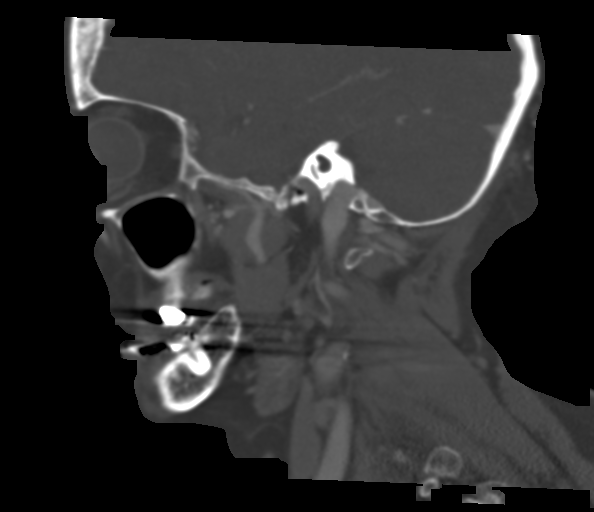

[Series 7: coronal bone · coronal · 0.32mm/px · 2 of 90 slices shown]
[im 30/90  bone]
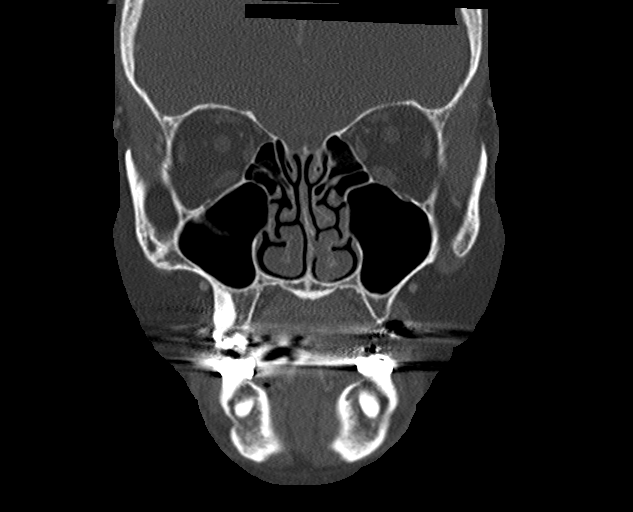
[im 60/90  bone]
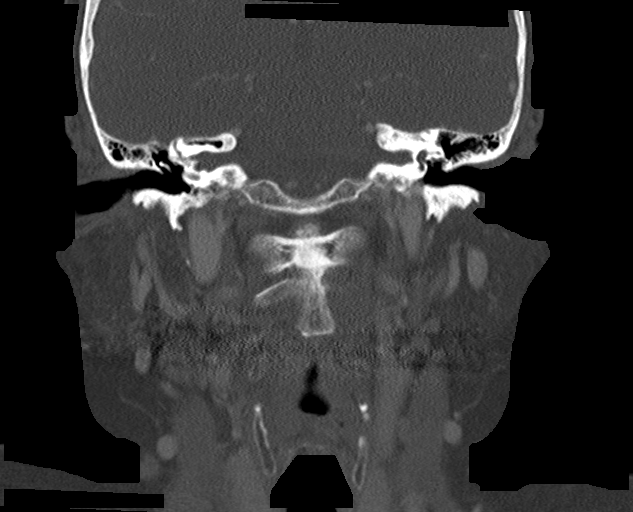

[15 of 47 positions shown; findings below may reference images not displayed]

FINDINGS: Osseous: No bone abnormality. Temporomandibular joints are normal.
No evidence of active dental or periodontal disease.

Orbits: Both globes are normal. Optic nerves appear normal. Orbital
fat is normal. Extra-ocular muscles are normal. Lacrimal glands are
normal.

Sinuses: Frontal, ethmoid, sphenoid and maxillary sinuses are
entirely clear. No sign of acute or chronic inflammatory disease.

Soft tissues: Parotid and submandibular glands appear normal. No
evidence of mass or stone disease. No other soft tissue finding in
the region, with specific attention to the left cheek. Dental streak
artifact does obscure a portion of that region. No evidence of upper
cervical lymphadenopathy.

Limited intracranial: Normal
IMPRESSION: Negative examination. No abnormality seen to explain the presenting
symptoms. No evidence of inflammatory disease or mass. No evidence
of active dental or periodontal disease. Streak artifact from dental
work does obscure a portion of the region of interest.

## 2023-11-14 ENCOUNTER — Other Ambulatory Visit (HOSPITAL_COMMUNITY): Payer: Self-pay | Admitting: Sports Medicine

## 2023-11-14 DIAGNOSIS — M5412 Radiculopathy, cervical region: Secondary | ICD-10-CM

## 2023-11-20 ENCOUNTER — Ambulatory Visit (HOSPITAL_COMMUNITY)
Admission: RE | Admit: 2023-11-20 | Discharge: 2023-11-20 | Disposition: A | Discharge status: Home / Self Care | Payer: Medicare Other | Source: Ambulatory Visit | Attending: Sports Medicine | Admitting: Sports Medicine

## 2023-11-20 DIAGNOSIS — M5412 Radiculopathy, cervical region: Secondary | ICD-10-CM | POA: Insufficient documentation

## 2024-02-07 ENCOUNTER — Other Ambulatory Visit: Payer: Self-pay | Admitting: *Deleted

## 2024-02-07 NOTE — Patient Outreach (Signed)
 Mrs. Wendy Barker utilized [facility] waiver for admission to [facility].  Screening for potential complex care management services as benefit of health plan and primary care provider.  Secure communication sent to [facility], Admissions Director to make aware writer will follow for transition plans and needs.   Raiford Noble, MSN, RN, BSN [place]  [facility], [facility] RN Post- Acute Care Manager Direct Dial: [phone]

## 2024-03-04 ENCOUNTER — Other Ambulatory Visit: Payer: Self-pay | Admitting: *Deleted

## 2024-03-04 NOTE — Patient Outreach (Signed)
 Post- Acute Care Manager follow up. [facility] SNF waiver previously utilized for admission to [facility]. Verified in [facility] Mrs. Benanti discharged from [facility] on 02/22/24. [facility] providing home health services.   No identifiable complex care management needs at this time.    Nolberto Batty, MSN, RN, BSN [place]  [facility], [facility] RN Post- Acute Care Manager Direct Dial: [phone]

## 2024-06-30 ENCOUNTER — Encounter (INDEPENDENT_AMBULATORY_CARE_PROVIDER_SITE_OTHER): Payer: Self-pay | Admitting: Otolaryngology

## 2024-06-30 ENCOUNTER — Ambulatory Visit (INDEPENDENT_AMBULATORY_CARE_PROVIDER_SITE_OTHER): Admitting: Otolaryngology

## 2024-06-30 VITALS — BP 145/68 | HR 69 | Ht <= 58 in | Wt 126.0 lb

## 2024-06-30 DIAGNOSIS — R040 Epistaxis: Secondary | ICD-10-CM | POA: Diagnosis not present

## 2024-06-30 DIAGNOSIS — J392 Other diseases of pharynx: Secondary | ICD-10-CM

## 2024-06-30 NOTE — Progress Notes (Signed)
 Dear Dr. Maree, Here is my assessment for our mutual patient, Wendy Barker. Thank you for allowing me the opportunity to care for your patient. Please do not hesitate to contact me should you have any other questions. Sincerely, Dr. Eldora Blanch  Otolaryngology Clinic Note Referring provider: Dr. Maree HPI:  Wendy Barker is a 78 y.o. female kindly referred by Dr. Maree for evaluation of bleeding from the throat and epistaxis  Initial visit (06/2024): Patient reports: was just sitting and watching TV and randomly and had blood in her mouth (last month, occurred twice), and then had an episode of nasal bleeding a week ago (from left). A teaspoon or less. Husband took a Building services engineer and reports that he saw blood from the pharynx (posterior pharyngeal wall - not from deeper area). Not mixed with saliva. Stopped on its own. Did not eat anything beforehand. No trauma to the area. No prior cough. Clears throat a lot. Throat does feel a bit dry. No nasal sprays. No GI symptoms. Patient otherwise denies: - dysphagia, odynophagia, PNA, need for Heimlich, unintentional weight loss - changes in voice, shortness of breath, hemoptysis - ear pain, neck masses  ENT Surgery: no Personal or FHx of bleeding dz or anesthesia difficulty: no  GLP-1: no AP/AC: no  Tobacco: no  PMHx: CVA, HLD, HTN, CHF, Cerebral amyloid angiopathy, Osteoporosis  Independent Review of Additional Tests or Records:  Dr. Maree Referral notes reviewed and uploaded or available in chart in media tab (05/20/2024): noted bleeding in throat - now two episodes; unclear cause; Dx: Throat bleeding; Rx: ref to [facility] (04/16/2024) reviewed and uploaded or available in chart in media tab (04/18/2024): WBC 7.5, Hgb 12.3, Plt 259; TSH 1.28, BUN/Cr 13/0.56 CT Face 08/17/2020 independently interpreted with respect to sinuses and proximal airway: no noted nasal or OP or hypopharynx masses  but study suboptimal as no contrast   PMH/Meds/All/SocHx/FamHx/ROS:   Past Medical History:  Diagnosis Date   Arthritis    High cholesterol    Hypertension    [facility]   Torn meniscus    Trigeminal neuralgia    [facility]      Past Surgical History:  Procedure Laterality Date   CESAREAN SECTION     x2   COLONOSCOPY     per [facility]   FOOT SURGERY     x4   HERNIA REPAIR     x4   MOUTH SURGERY     per [facility]     Family History  Problem Relation Age of Onset   High blood pressure Mother    Diabetes Mother    Cancer Mother    High blood pressure Father    Alcoholism Father    Heart Problems Father    High blood pressure Other    Breast cancer Other        12 maternal family members      Social Connections: Moderately Integrated (10/02/2022)   Received from [facility]   Social Network    How would you rate your social network (family, work, friends)?: Adequate participation with social networks      Current Outpatient Medications:    acetaminophen (TYLENOL) 500 MG tablet, Take 1,000 mg by mouth in the morning, at noon, and at bedtime., Disp: , Rfl:    atenolol (TENORMIN) 50 MG tablet, Take 50 mg by mouth daily., Disp: ,  Rfl:    atorvastatin (LIPITOR) 20 MG tablet, Take 20 mg by mouth daily., Disp: , Rfl:    gabapentin (NEURONTIN) 300 MG capsule, Take 300 mg by mouth 2 (two) times daily. , Disp: , Rfl:    lamoTRIgine  (LAMICTAL ) 25 MG tablet, Week 1: 25mg  (one pill) at night  Week 2: 25mg  (one pill) twice daily Week 3: 50mg  (two pills) night and 25mg  morning  Week 4: 50mg  (two pills) twice daily  Week 5: 75mg  (three pills) night and 50 mg (two pills) morning  Week 6: 75mg  (three pills) twice daily Week 7: 100mg  (four pills) night and 75mg  (three pills) morning Week 8: four pills (100mg ) twice daily, Disp: 240 tablet, Rfl: 2   melatonin 1 MG TABS  tablet, Take 2 mg by mouth at bedtime., Disp: , Rfl:    triamterene-hydrochlorothiazide (DYAZIDE) 37.5-25 MG capsule, Take 1 capsule by mouth daily., Disp: , Rfl:    Physical Exam:   BP (!) 145/68 (BP Location: Left Arm, Patient Position: Sitting, Cuff Size: Normal)   Pulse 69   Ht 4' 10 (1.473 m)   Wt 126 lb (57.2 kg)   SpO2 96%   BMI 26.33 kg/m   Salient findings:  CN II-XII intact Bilateral EAC clear and TM intact with well pneumatized middle ear spaces Anterior rhinoscopy: Septum relatively midline, no significant prominent septal vessels; bilateral inferior turbinates without significant hypertrophy; Nasal endoscopy was indicated to better evaluate the nose and paranasal sinuses, given the patient's history and exam findings, and is detailed below. No lesions of oral cavity/oropharynx; dentition fair; unable to appreciate any palpable tongue base mass No obviously palpable neck masses/lymphadenopathy/thyromegaly No respiratory distress or stridor; TFL was indicated to better evaluate the proximal airway, given the patient's history and exam findings, and is detailed below.  Seprately Identifiable Procedures:  Prior to initiating any procedures, risks/benefits/alternatives were explained to the patient and verbal consent obtained. PROCEDURE: Bilateral Diagnostic Rigid Nasal Endoscopy Pre-procedure diagnosis: Epistaxis Post-procedure diagnosis: same Indication: See pre-procedure diagnosis and physical exam above Complications: None apparent EBL: 0 mL Anesthesia: Lidocaine 4% and topical decongestant was topically sprayed in each nasal cavity  Description of Procedure:  Patient was identified. A rigid 30 degree endoscope was utilized to evaluate the sinonasal cavities, mucosa, sinus ostia and turbinates and septum.  Overall, signs of mucosal inflammation are not noted.  Also noted is a small granuloma just above head of inferior turbinate with slight mucosal trauma but hemostatic.   No mucopurulence, polyps noted.   Right Middle meatus: clear Right SE Recess: clear Left MM: clear Left SE Recess: clear    Left granuloma left IT head  Photodocumentation was obtained.  CPT CODE -- 68768 - Mod 25  Procedure Note Pre-procedure diagnosis: Oropharyngeal bleeding Post-procedure diagnosis: Same Procedure: Transnasal Fiberoptic Laryngoscopy, CPT 31575 - Mod 25 Indication: see above Complications: None apparent EBL: 0 mL  The procedure was undertaken to further evaluate the patient's complaint above, with mirror exam inadequate for appropriate examination due to gag reflex and poor patient tolerance  Procedure:  Patient was identified as correct patient. Verbal consent was obtained. The nose was sprayed with oxymetazoline and 4% lidocaine. The The flexible laryngoscope was passed through the nose to view the nasal cavity, pharynx (oropharynx, hypopharynx) and larynx.  The larynx was examined at rest and during multiple phonatory tasks. Documentation was obtained and reviewed with patient. The scope was removed. The patient tolerated the procedure well.  Findings: The nasal cavity and nasopharynx did not  reveal any masses or lesions, mucosa appeared to be without obvious lesions. The tongue base, pharyngeal walls, piriform sinuses, vallecula, epiglottis and postcricoid region are normal in appearance. The visualized portion of the subglottis and proximal trachea is widely patent. The vocal folds are mobile bilaterally. There are no lesions on the free edge of the vocal folds nor elsewhere in the larynx worrisome for malignancy.  No obvious mass or source to explain bleeding    Electronically signed by: Eldora KATHEE Blanch, MD 06/30/2024 4:52 PM    Impression & Plans:  Wendy Barker is a 78 y.o. female with:  1. Epistaxis   2. Oropharyngeal bleeding    Epistaxis - likely from left nasal lesion which is quite small (<45mm). She did manipulate nose prior and nose is also  dry. We discussed options and will start with humidification with ayr gel; if no improvement, consider bx/excision  Throat(?) bleeding - perhaps related to irritation from dry throat? We discussed unclear exact etiology and I did offer GI and Pulm referral to rule out those sources; TFL reassuring; given it has not occurred in over a month and based on their history, they would like to observe; will f/u in 2 months, sooner as necessary - if recurs, advised to call and we will send GI/Pulm referrals and get CT and CXR   - f/u 8 weeks  See below regarding exact medications prescribed this encounter including dosages and route: No orders of the defined types were placed in this encounter.     Thank you for allowing me the opportunity to care for your patient. Please do not hesitate to contact me should you have any other questions.  Sincerely, Eldora Blanch, MD Otolaryngologist (ENT), [facility] Phone: [phone] Fax: [phone]  06/30/2024, 4:52 PM   I have personally spent 47 minutes involved in face-to-face and non-face-to-face activities for this patient on the day of the visit.  Professional time spent excludes any procedures performed but includes the following activities, in addition to those noted in the documentation: preparing to see the patient (review of outside documentation and results), performing a medically appropriate examination, counseling, documenting in the electronic health record, independently interpreting results (CT)

## 2024-06-30 NOTE — Patient Instructions (Signed)
  Ayr gel: Apply a pea sized amount up to 4 times daily just to inside of each nostril like above, then pinch your nose for 10 seconds. Use this consistently.      Do salt water gargles daily

## 2024-08-26 ENCOUNTER — Ambulatory Visit (INDEPENDENT_AMBULATORY_CARE_PROVIDER_SITE_OTHER): Admitting: Otolaryngology

## 2024-10-22 ENCOUNTER — Other Ambulatory Visit: Payer: Self-pay

## 2024-10-22 ENCOUNTER — Ambulatory Visit: Payer: Self-pay | Admitting: Physical Therapy

## 2024-10-22 DIAGNOSIS — M6281 Muscle weakness (generalized): Secondary | ICD-10-CM | POA: Diagnosis present

## 2024-10-22 DIAGNOSIS — M25651 Stiffness of right hip, not elsewhere classified: Secondary | ICD-10-CM | POA: Insufficient documentation

## 2024-10-22 DIAGNOSIS — M25551 Pain in right hip: Secondary | ICD-10-CM | POA: Diagnosis present

## 2024-10-22 NOTE — Therapy (Signed)
 OUTPATIENT PHYSICAL THERAPY LOWER EXTREMITY EVALUATION   Patient Name: Wendy Barker MRN: [id number] DOB:11-Dec-1945, 78 y.o., female Today's Date: 10/22/2024  END OF SESSION:  PT End of Session - 10/22/24 1252     Visit Number 1    Number of Visits 16    Date for Recertification  12/17/24    Authorization Type Medicare    PT Start Time 1300    PT Stop Time 1340    PT Time Calculation (min) 40 min    Activity Tolerance Patient tolerated treatment well    Behavior During Therapy WFL for tasks assessed/performed          Past Medical History:  Diagnosis Date   Arthritis    High cholesterol    Hypertension    [facility]   Torn meniscus    Trigeminal neuralgia    [facility]    Past Surgical History:  Procedure Laterality Date   CESAREAN SECTION     x2   COLONOSCOPY     per [facility]   FOOT SURGERY     x4   HERNIA REPAIR     x4   MOUTH SURGERY     [facility]    Patient Active Problem List   Diagnosis Date Noted   Benign essential hypertension 06/12/2019   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   DDD (degenerative disc disease), lumbar 06/12/2019   Depressive disorder 06/12/2019   Insomnia 06/12/2019   Trigeminal neuralgia of left side of face 01/22/2018   Burning tongue syndrome 11/13/2016   Chronic anxiety 11/13/2016   Controlled type 2 diabetes mellitus with complication, with long-term current use of insulin (HCC) 11/13/2016   Dysesthesia 11/13/2016   Sciatica 03/26/2014   Hyperlipidemia, unspecified 02/26/2012    PCP: Maree Isles, MD  REFERRING PROVIDER: Daniel Norleen Barter, PA-C  REFERRING DIAG: M70.61 (ICD-10-CM) - Trochanteric bursitis, right hip  THERAPY DIAG:  Pain in right hip  Stiffness of right hip, not elsewhere classified  Muscle weakness (generalized)  Rationale for Evaluation and  Treatment: Rehabilitation  ONSET DATE: since March 2025  SUBJECTIVE:   SUBJECTIVE STATEMENT: Pt was told she had R hip bursitis. Pt states when she sits it feels like she's sitting on a lump. Didn't see an improvement with the shot. Less pain when she's moving around or standing. Has steps to the basement to the washer/dryer but has been driving around the house to get down there. Has been wearing a slight heel lift on the L. Pt states she went to short term rehab for 17 days and then had HHPT after her hip replacement.   PERTINENT HISTORY: R THA (March 2025), osteoporosis  PAIN:  Are you having pain? Yes: NPRS scale: currently 6 or 7; at worst 8 Pain location: R posterior hip Pain description: like sitting on a lump Aggravating factors: sitting, worsens later in the day Relieving factors: not sitting on it, heat  PRECAUTIONS: None  RED FLAGS: None   WEIGHT BEARING RESTRICTIONS: No  FALLS:  Has patient fallen in last 6 months? No  LIVING ENVIRONMENT: Lives with: lives with their spouse Lives in: House/apartment Stairs: Level in front Has following equipment at home: None  OCCUPATION: Retired  PLOF: Independent  PATIENT GOALS: Improve hip pain  NEXT MD VISIT: PRN  OBJECTIVE:  Note: Objective measures were completed at Evaluation unless otherwise noted.  DIAGNOSTIC  FINDINGS: None seen on Epic  PATIENT SURVEYS:  LEFS  Extreme difficulty/unable (0), Quite a bit of difficulty (1), Moderate difficulty (2), Little difficulty (3), No difficulty (4) Survey date:  10/22/24  Any of your usual work, housework or school activities 3  2. Usual hobbies, recreational or sporting activities 2  3. Getting into/out of the bath 4  4. Walking between rooms 4  5. Putting on socks/shoes 4  6. Squatting  4  7. Lifting an object, like a bag of groceries from the floor 2  8. Performing light activities around your home 3  9. Performing heavy activities around your home 2  10.  Getting into/out of a car 4  11. Walking 2 blocks 3  12. Walking 1 mile 3  13. Going up/down 10 stairs (1 flight) 3  14. Standing for 1 hour 4  15.  sitting for 1 hour 2  16. Running on even ground 2  17. Running on uneven ground 1  18. Making sharp turns while running fast 2  19. Hopping  3  20. Rolling over in bed 4  Score total:  59     COGNITION: Overall cognitive status: Within functional limits for tasks assessed     SENSATION: WFL  EDEMA:  None  MUSCLE LENGTH: Hamstrings: WNL bilat True leg length measurement: To be assessed  POSTURE: R hip drop and lateral shift in standing, mild trunk flexion  PALPATION: TTP posterior femoracetabular joint and into greater trochanter. TTP glute med and piriformis  LOWER EXTREMITY ROM:  Active ROM Right eval Left eval  Hip flexion    Hip extension    Hip abduction    Hip adduction    Hip internal rotation    Hip external rotation    Knee flexion    Knee extension    Ankle dorsiflexion    Ankle plantarflexion    Ankle inversion    Ankle eversion     (Blank rows = not tested)  LOWER EXTREMITY MMT:  MMT Right eval Left eval  Hip flexion 3+ 4  Hip extension 3+ 4-  Hip abduction 3+ 4  Hip adduction    Hip internal rotation    Hip external rotation    Knee flexion 4- 5  Knee extension 3+ 5  Ankle dorsiflexion    Ankle plantarflexion    Ankle inversion    Ankle eversion     (Blank rows = not tested)  LOWER EXTREMITY SPECIAL TESTS:  Hip special tests: Belvie (FABER) test: positive  and Trendelenburg test: positive   FUNCTIONAL TESTS:  L tandem stance: 7.26 sec, R tandem stance: 22.69 sec SLS: <1 sec on R LE: 3 sec on L LE  GAIT: Distance walked: Into clinic Assistive device utilized: None Level of assistance: SBA Comments: Reciprocal pattern, R hip drop noted during stance  TREATMENT DATE: 10/22/24 See HEP below    PATIENT EDUCATION:  Education details: Exam findings, POC, initial HEP Person educated: Patient Education method: Explanation, Demonstration, and Handouts Education comprehension: verbalized understanding, returned demonstration, and needs further education  HOME EXERCISE PROGRAM: Access Code: [email or web address] URL: [email or web address] Date: 10/22/2024 Prepared by: Kayleen Alig April Earnie Starring  Exercises - Seated Piriformis Stretch  - 2-3 x daily - 7 x weekly - 2 sets - 30 sec hold - Seated Piriformis Stretch with Trunk Bend  - 2-3 x daily - 7 x weekly - 2 sets - 30 sec hold - Right Standing Lateral Shift Correction at Wall - Hold  - 2-3 x daily - 7 x weekly - 1 sets - 10 reps - Standing Hip Hiking  - 2-3 x daily - 7 x weekly - 1 sets - 10 reps  ASSESSMENT:  CLINICAL IMPRESSION: Patient is a 78 y.o. F who was seen today for physical therapy evaluation and treatment for R hip pain. Assessment is significant for R>L femoracetabular hypomobility, R>L LE weakness and tenderness and overall increased fall risk based on inability to perform tandem stance or SLS affecting safe home and community mobility. Pt will benefit from PT to address these issues to maximize her level of function.   OBJECTIVE IMPAIRMENTS: Abnormal gait, decreased activity tolerance, decreased balance, decreased coordination, decreased endurance, decreased mobility, decreased ROM, decreased strength, hypomobility, increased fascial restrictions, impaired flexibility, improper body mechanics, postural dysfunction, and pain.   ACTIVITY LIMITATIONS: carrying, lifting, bending, sitting, standing, squatting, stairs, and transfers  PARTICIPATION LIMITATIONS: shopping, community activity, church, and recreational activities  PERSONAL FACTORS: Age, Fitness, Past/current experiences, and Time since onset of injury/illness/exacerbation are also affecting patient's functional  outcome.   REHAB POTENTIAL: Good  CLINICAL DECISION MAKING: Evolving/moderate complexity  EVALUATION COMPLEXITY: Moderate   GOALS: Goals reviewed with patient? Yes  SHORT TERM GOALS: Target date: 11/19/2024  Pt will be ind with initial HEP Baseline: Goal status: INITIAL  2.  Pt will report improved sitting tolerance by >/=50% Baseline:  Goal status: INITIAL  3.  Pt will demo improved tandem stance to at least 20 sec bilat to demo increased balance and standing stability Baseline:  Goal status: INITIAL  4.  Pt will demo L = R hip mobility during FABER testing Baseline:  Goal status: INITIAL   LONG TERM GOALS: Target date: 12/17/2024   Pt will be ind with management and progression of HEP Baseline:  Goal status: INITIAL  2.  Pt will demo increased hip strength to at least 4/5 bilat for standing and postural stability Baseline:  Goal status: INITIAL  3.  Pt will report improved symptoms by >/=75% Baseline:  Goal status: INITIAL  4.  Pt will have improved LEFS to >/=71 to demo MCID Baseline:  Goal status: INITIAL  5.  Pt will be able to stand at least 10 sec during SLS to demo increased single leg stability and strength Baseline:  Goal status: INITIAL    PLAN:  PT FREQUENCY: 2x/week  PT DURATION: 8 weeks  PLANNED INTERVENTIONS: 97164- PT Re-evaluation, 97750- Physical Performance Testing, 97110-Therapeutic exercises, 97530- Therapeutic activity, 97112- Neuromuscular re-education, 97535- Self Care, [id number]- Manual therapy, [id number]- Electrical stimulation (unattended), Patient/Family education, Balance training, Taping, Joint mobilization, Spinal mobilization, Cryotherapy, and Moist heat  PLAN FOR NEXT SESSION: Assess response to HEP. Stretch/mobilize hips. Modalities and manual if indicated. Strengthen and stabilize LEs and core.   Tanay Massiah April Ma L Valinda Fedie, PT, DPT 10/22/2024, 1:57 PM

## 2024-10-24 ENCOUNTER — Ambulatory Visit: Admitting: Physical Therapy

## 2024-10-27 ENCOUNTER — Encounter: Payer: Self-pay | Admitting: Physical Therapy

## 2024-10-27 ENCOUNTER — Ambulatory Visit: Admitting: Physical Therapy

## 2024-10-27 DIAGNOSIS — M25651 Stiffness of right hip, not elsewhere classified: Secondary | ICD-10-CM

## 2024-10-27 DIAGNOSIS — M6281 Muscle weakness (generalized): Secondary | ICD-10-CM

## 2024-10-27 DIAGNOSIS — M25551 Pain in right hip: Secondary | ICD-10-CM | POA: Diagnosis not present

## 2024-10-27 NOTE — Therapy (Signed)
 " OUTPATIENT PHYSICAL THERAPY TREATMENT   Patient Name: Wendy Barker MRN: [id number] DOB:1946/08/26, 78 y.o., female Today's Date: 10/27/2024  END OF SESSION:  PT End of Session - 10/27/24 1143     Visit Number 2    Number of Visits 16    Date for Recertification  12/17/24    Authorization Type Medicare    PT Start Time 1015    PT Stop Time 1100    PT Time Calculation (min) 45 min    Activity Tolerance Patient tolerated treatment well    Behavior During Therapy WFL for tasks assessed/performed           Past Medical History:  Diagnosis Date   Arthritis    High cholesterol    Hypertension    per [facility]   Torn meniscus    Trigeminal neuralgia    per notes from [facility]    Past Surgical History:  Procedure Laterality Date   CESAREAN SECTION     x2   COLONOSCOPY     per [facility]   FOOT SURGERY     x4   HERNIA REPAIR     x4   MOUTH SURGERY     [facility]    Patient Active Problem List   Diagnosis Date Noted   Benign essential hypertension 06/12/2019   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   DDD (degenerative disc disease), lumbar 06/12/2019   Depressive disorder 06/12/2019   Insomnia 06/12/2019   Trigeminal neuralgia of left side of face 01/22/2018   Burning tongue syndrome 11/13/2016   Chronic anxiety 11/13/2016   Controlled type 2 diabetes mellitus with complication, with long-term current use of insulin (HCC) 11/13/2016   Dysesthesia 11/13/2016   Sciatica 03/26/2014   Hyperlipidemia, unspecified 02/26/2012    PCP: Maree Isles, MD  REFERRING PROVIDER: Daniel Norleen Barter, PA-C  REFERRING DIAG: M70.61 (ICD-10-CM) - Trochanteric bursitis, right hip  THERAPY DIAG:  Pain in right hip  Stiffness of right hip, not elsewhere classified  Muscle weakness (generalized)  Rationale for Evaluation and Treatment:  Rehabilitation  ONSET DATE: since March 2025  SUBJECTIVE:   SUBJECTIVE STATEMENT: She says pain is always there, worse with sitting. She is interested in joining the gym here.    Per Eval Pt was told she had R hip bursitis. Pt states when she sits it feels like she's sitting on a lump. Didn't see an improvement with the shot. Less pain when she's moving around or standing. Has steps to the basement to the washer/dryer but has been driving around the house to get down there. Has been wearing a slight heel lift on the L. Pt states she went to short term rehab for 17 days and then had HHPT after her hip replacement.   PERTINENT HISTORY: R THA (March 2025), osteoporosis  PAIN:  Are you having pain? Yes: NPRS scale: currently 6 or 7; at worst 8 Pain location: R posterior hip Pain description: like sitting on a lump Aggravating factors: sitting, worsens later in the day Relieving factors: not sitting on it, heat  PRECAUTIONS: None  RED FLAGS: None   WEIGHT BEARING RESTRICTIONS: No  FALLS:  Has patient fallen in last 6 months? No  LIVING ENVIRONMENT: Lives with: lives with their spouse Lives in: House/apartment Stairs: Level in front Has following equipment at home: None  OCCUPATION: Retired  PLOF: Independent  PATIENT GOALS: Improve  hip pain  NEXT MD VISIT: PRN  OBJECTIVE:  Note: Objective measures were completed at Evaluation unless otherwise noted.  DIAGNOSTIC FINDINGS: None seen on Epic  PATIENT SURVEYS:  LEFS  Extreme difficulty/unable (0), Quite a bit of difficulty (1), Moderate difficulty (2), Little difficulty (3), No difficulty (4) Survey date:  10/22/24  Any of your usual work, housework or school activities 3  2. Usual hobbies, recreational or sporting activities 2  3. Getting into/out of the bath 4  4. Walking between rooms 4  5. Putting on socks/shoes 4  6. Squatting  4  7. Lifting an object, like a bag of groceries from the floor 2  8.  Performing light activities around your home 3  9. Performing heavy activities around your home 2  10. Getting into/out of a car 4  11. Walking 2 blocks 3  12. Walking 1 mile 3  13. Going up/down 10 stairs (1 flight) 3  14. Standing for 1 hour 4  15.  sitting for 1 hour 2  16. Running on even ground 2  17. Running on uneven ground 1  18. Making sharp turns while running fast 2  19. Hopping  3  20. Rolling over in bed 4  Score total:  59     COGNITION: Overall cognitive status: Within functional limits for tasks assessed     SENSATION: WFL  EDEMA:  None  MUSCLE LENGTH: Hamstrings: WNL bilat True leg length measurement: To be assessed  POSTURE: R hip drop and lateral shift in standing, mild trunk flexion  PALPATION: TTP posterior femoracetabular joint and into greater trochanter. TTP glute med and piriformis  LOWER EXTREMITY ROM:  Active ROM Right eval Left eval  Hip flexion    Hip extension    Hip abduction    Hip adduction    Hip internal rotation    Hip external rotation    Knee flexion    Knee extension    Ankle dorsiflexion    Ankle plantarflexion    Ankle inversion    Ankle eversion     (Blank rows = not tested)  LOWER EXTREMITY MMT:  MMT Right eval Left eval  Hip flexion 3+ 4  Hip extension 3+ 4-  Hip abduction 3+ 4  Hip adduction    Hip internal rotation    Hip external rotation    Knee flexion 4- 5  Knee extension 3+ 5  Ankle dorsiflexion    Ankle plantarflexion    Ankle inversion    Ankle eversion     (Blank rows = not tested)  LOWER EXTREMITY SPECIAL TESTS:  Hip special tests: Belvie (FABER) test: positive  and Trendelenburg test: positive   FUNCTIONAL TESTS:  L tandem stance: 7.26 sec, R tandem stance: 22.69 sec SLS: <1 sec on R LE: 3 sec on L LE  GAIT: Distance walked: Into clinic Assistive device utilized: None Level of assistance: SBA Comments: Reciprocal pattern, R hip drop noted during stance  TREATMENT DATE:   10/27/24 Nu step seat #4, L4 X 10 min UE/LE Leg press machine attempted DL at 89#, unable to move without min A so discontinued and will work toward this in future Leg extension machine 10# [id number] Hamstring curl machine 10# 2X10 Hip abduction machine 10# 2X10 Seated pirifrormis stretch pushing knee down 30 sec X 2 on Rt Seated piriformis stretch knee to opposite shoulder 30 sec X 2 on Rt Standing hip hike X 10 bilat Standing tandem balance 30 sec X 2, one rep Rt behind, one rep Lt behind Discussed gym equipment that would be safe for her to do and gym membership benefits and details  PATIENT EDUCATION:  Education details: Exam findings, POC, initial HEP Person educated: Patient Education method: Explanation, Demonstration, and Handouts Education comprehension: verbalized understanding, returned demonstration, and needs further education  HOME EXERCISE PROGRAM: Access Code: [email or web address] URL: [email or web address] Date: 10/22/2024 Prepared by: Gellen April Earnie Starring  Exercises - Seated Piriformis Stretch  - 2-3 x daily - 7 x weekly - 2 sets - 30 sec hold - Seated Piriformis Stretch with Trunk Bend  - 2-3 x daily - 7 x weekly - 2 sets - 30 sec hold - Right Standing Lateral Shift Correction at Wall - Hold  - 2-3 x daily - 7 x weekly - 1 sets - 10 reps - Standing Hip Hiking  - 2-3 x daily - 7 x weekly - 1 sets - 10 reps  ASSESSMENT:  CLINICAL IMPRESSION: Since she was interested in strength training and joining gym here I discussed with her gym equipment that would be safe for her to do and gym membership benefits and details. She tolerated this well in session today except not strong enough yet for lowest setting on leg press machine. I feel she can definitely get there as she gets stronger. We will monitor for sourness and adjust PRN.   Per  Eval Patient is a 78 y.o. F who was seen today for physical therapy evaluation and treatment for R hip pain. Assessment is significant for R>L femoracetabular hypomobility, R>L LE weakness and tenderness and overall increased fall risk based on inability to perform tandem stance or SLS affecting safe home and community mobility. Pt will benefit from PT to address these issues to maximize her level of function.   OBJECTIVE IMPAIRMENTS: Abnormal gait, decreased activity tolerance, decreased balance, decreased coordination, decreased endurance, decreased mobility, decreased ROM, decreased strength, hypomobility, increased fascial restrictions, impaired flexibility, improper body mechanics, postural dysfunction, and pain.   ACTIVITY LIMITATIONS: carrying, lifting, bending, sitting, standing, squatting, stairs, and transfers  PARTICIPATION LIMITATIONS: shopping, community activity, church, and recreational activities  PERSONAL FACTORS: Age, Fitness, Past/current experiences, and Time since onset of injury/illness/exacerbation are also affecting patient's functional outcome.   REHAB POTENTIAL: Good  CLINICAL DECISION MAKING: Evolving/moderate complexity  EVALUATION COMPLEXITY: Moderate   GOALS: Goals reviewed with patient? Yes  SHORT TERM GOALS: Target date: 11/19/2024  Pt will be ind with initial HEP Baseline: Goal status: INITIAL  2.  Pt will report improved sitting tolerance by >/=50% Baseline:  Goal status: INITIAL  3.  Pt will demo improved tandem stance to at least 20 sec bilat to demo increased balance and standing stability Baseline:  Goal status: INITIAL  4.  Pt will demo L = R hip mobility during FABER testing Baseline:  Goal status: INITIAL   LONG TERM GOALS: Target date: 12/17/2024   Pt will be ind with management and progression of HEP Baseline:  Goal  status: INITIAL  2.  Pt will demo increased hip strength to at least 4/5 bilat for standing and postural  stability Baseline:  Goal status: INITIAL  3.  Pt will report improved symptoms by >/=75% Baseline:  Goal status: INITIAL  4.  Pt will have improved LEFS to >/=71 to demo MCID Baseline:  Goal status: INITIAL  5.  Pt will be able to stand at least 10 sec during SLS to demo increased single leg stability and strength Baseline:  Goal status: INITIAL    PLAN:  PT FREQUENCY: 2x/week  PT DURATION: 8 weeks  PLANNED INTERVENTIONS: 97164- PT Re-evaluation, 97750- Physical Performance Testing, 97110-Therapeutic exercises, 97530- Therapeutic activity, 97112- Neuromuscular re-education, 97535- Self Care, [id number]- Manual therapy, [id number]- Electrical stimulation (unattended), Patient/Family education, Balance training, Taping, Joint mobilization, Spinal mobilization, Cryotherapy, and Moist heat  PLAN FOR NEXT SESSION: Assess response to exercise program,  Modalities and manual if indicated. Strengthen and stabilize LEs and core.   Redell JONELLE Moose, PT, DPT 10/27/2024, 11:44 AM  "

## 2024-11-05 ENCOUNTER — Ambulatory Visit: Admitting: Physical Therapy

## 2024-11-12 ENCOUNTER — Encounter: Payer: Self-pay | Admitting: Physical Therapy

## 2024-11-12 ENCOUNTER — Ambulatory Visit: Attending: Internal Medicine | Admitting: Physical Therapy

## 2024-11-12 DIAGNOSIS — M6281 Muscle weakness (generalized): Secondary | ICD-10-CM | POA: Insufficient documentation

## 2024-11-12 DIAGNOSIS — M25651 Stiffness of right hip, not elsewhere classified: Secondary | ICD-10-CM | POA: Insufficient documentation

## 2024-11-12 DIAGNOSIS — M25551 Pain in right hip: Secondary | ICD-10-CM | POA: Insufficient documentation

## 2024-11-12 NOTE — Therapy (Signed)
 " OUTPATIENT PHYSICAL THERAPY TREATMENT   Patient Name: Wendy Barker MRN: [id number] DOB:[birth year], 79 y.o., female Today's Date: 11/12/2024  END OF SESSION:  PT End of Session - 11/12/24 1338     Visit Number 3    Number of Visits 16    Date for Recertification  12/17/24    Authorization Type Medicare    PT Start Time 1340    PT Stop Time 1420    PT Time Calculation (min) 40 min    Activity Tolerance Patient tolerated treatment well    Behavior During Therapy WFL for tasks assessed/performed            Past Medical History:  Diagnosis Date   Arthritis    High cholesterol    Hypertension    per notes from [facility]   Torn meniscus    Trigeminal neuralgia    per notes [facility]    Past Surgical History:  Procedure Laterality Date   CESAREAN SECTION     x2   COLONOSCOPY     per [facility]   FOOT SURGERY     x4   HERNIA REPAIR     x4   MOUTH SURGERY     per [facility]    Patient Active Problem List   Diagnosis Date Noted   Benign essential hypertension 06/12/2019   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   DDD (degenerative disc disease), lumbar 06/12/2019   Depressive disorder 06/12/2019   Insomnia 06/12/2019   Trigeminal neuralgia of left side of face 01/22/2018   Burning tongue syndrome 11/13/2016   Chronic anxiety 11/13/2016   Controlled type 2 diabetes mellitus with complication, with long-term current use of insulin (HCC) 11/13/2016   Dysesthesia 11/13/2016   Sciatica 03/26/2014   Hyperlipidemia, unspecified 02/26/2012    PCP: Maree Isles, MD  REFERRING PROVIDER: Daniel Norleen Barter, PA-C  REFERRING DIAG: M70.61 (ICD-10-CM) - Trochanteric bursitis, right hip  THERAPY DIAG:  Pain in right hip  Stiffness of right hip, not elsewhere classified  Muscle weakness (generalized)  Rationale for Evaluation and Treatment:  Rehabilitation  ONSET DATE: since March 2025  SUBJECTIVE:   SUBJECTIVE STATEMENT: Pt states she's been doing the gym classes when not in PT.    Per Eval Pt was told she had R hip bursitis. Pt states when she sits it feels like she's sitting on a lump. Didn't see an improvement with the shot. Less pain when she's moving around or standing. Has steps to the basement to the washer/dryer but has been driving around the house to get down there. Has been wearing a slight heel lift on the L. Pt states she went to short term rehab for 17 days and then had HHPT after her hip replacement.   PERTINENT HISTORY: R THA (March 2025), osteoporosis  PAIN:  Are you having pain? Yes: NPRS scale: currently 6 or 7; at worst 8 Pain location: R posterior hip Pain description: like sitting on a lump Aggravating factors: sitting, worsens later in the day Relieving factors: not sitting on it, heat  PRECAUTIONS: None  RED FLAGS: None   WEIGHT BEARING RESTRICTIONS: No  FALLS:  Has patient fallen in last 6 months? No  LIVING ENVIRONMENT: Lives with: lives with their spouse Lives in: House/apartment Stairs: Level in front Has following equipment at home: None  OCCUPATION: Retired  PLOF: Independent  PATIENT GOALS: Improve hip pain  NEXT  MD VISIT: PRN  OBJECTIVE:  Note: Objective measures were completed at Evaluation unless otherwise noted.  DIAGNOSTIC FINDINGS: None seen on Epic  PATIENT SURVEYS:  LEFS  Extreme difficulty/unable (0), Quite a bit of difficulty (1), Moderate difficulty (2), Little difficulty (3), No difficulty (4) Survey date:  10/22/24  Any of your usual work, housework or school activities 3  2. Usual hobbies, recreational or sporting activities 2  3. Getting into/out of the bath 4  4. Walking between rooms 4  5. Putting on socks/shoes 4  6. Squatting  4  7. Lifting an object, like a bag of groceries from the floor 2  8. Performing light activities around your home  3  9. Performing heavy activities around your home 2  10. Getting into/out of a car 4  11. Walking 2 blocks 3  12. Walking 1 mile 3  13. Going up/down 10 stairs (1 flight) 3  14. Standing for 1 hour 4  15.  sitting for 1 hour 2  16. Running on even ground 2  17. Running on uneven ground 1  18. Making sharp turns while running fast 2  19. Hopping  3  20. Rolling over in bed 4  Score total:  59     COGNITION: Overall cognitive status: Within functional limits for tasks assessed     SENSATION: WFL  EDEMA:  None  MUSCLE LENGTH: Hamstrings: WNL bilat True leg length measurement: To be assessed  POSTURE: R hip drop and lateral shift in standing, mild trunk flexion  PALPATION: TTP posterior femoracetabular joint and into greater trochanter. TTP glute med and piriformis  LOWER EXTREMITY ROM:  Active ROM Right eval Left eval  Hip flexion    Hip extension    Hip abduction    Hip adduction    Hip internal rotation    Hip external rotation    Knee flexion    Knee extension    Ankle dorsiflexion    Ankle plantarflexion    Ankle inversion    Ankle eversion     (Blank rows = not tested)  LOWER EXTREMITY MMT:  MMT Right eval Left eval  Hip flexion 3+ 4  Hip extension 3+ 4-  Hip abduction 3+ 4  Hip adduction    Hip internal rotation    Hip external rotation    Knee flexion 4- 5  Knee extension 3+ 5  Ankle dorsiflexion    Ankle plantarflexion    Ankle inversion    Ankle eversion     (Blank rows = not tested)  LOWER EXTREMITY SPECIAL TESTS:  Hip special tests: Belvie (FABER) test: positive  and Trendelenburg test: positive   FUNCTIONAL TESTS:  L tandem stance: 7.26 sec, R tandem stance: 22.69 sec SLS: <1 sec on R LE: 3 sec on L LE  GAIT: Distance walked: Into clinic Assistive device utilized: None Level of assistance: SBA Comments: Reciprocal pattern, R hip drop noted during stance  TREATMENT DATE:  11/12/24 Nustep seat #4, L5 x10 min UEs/LEs Seated figure 4 stretch x 30 Seated piriformis stretch x 30 Seated hip flexor stretch x 30 Seated hip clamshell green TB 3x10 -- cueing to keep toes pointed forward Seated adductor stretch 2x 30 Sit<>stand green TB around thighs 2x10 Fwd lunge stepping on yoga block, pushing knee out into ball on wall to keep from valgus and increase glute med firing 2x10 Standing hip hike on yoga block 2x10 Fwd step up on yoga block x10 Fwd step up on aerobic stepper (level 3) ~8 R LE only 2x10, tactile and verbal cueing to keep from knee valgus   10/27/24 Nu step seat #4, L4 X 10 min UE/LE Leg press machine attempted DL at 89#, unable to move without min A so discontinued and will work toward this in future Leg extension machine 10# [id number] Hamstring curl machine 10# 2X10 Hip abduction machine 10# 2X10 Seated pirifrormis stretch pushing knee down 30 sec X 2 on Rt Seated piriformis stretch knee to opposite shoulder 30 sec X 2 on Rt Standing hip hike X 10 bilat Standing tandem balance 30 sec X 2, one rep Rt behind, one rep Lt behind Discussed gym equipment that would be safe for her to do and gym membership benefits and details  PATIENT EDUCATION:  Education details: Exam findings, POC, initial HEP Person educated: Patient Education method: Explanation, Demonstration, and Handouts Education comprehension: verbalized understanding, returned demonstration, and needs further education  HOME EXERCISE PROGRAM: Access Code: [email or web address] URL: [email or web address] Date: 11/12/2024 Prepared by: Dalton Molesworth April Earnie Starring  Exercises - Seated Piriformis Stretch  - 2-3 x daily - 7 x weekly - 2 sets - 30 sec hold - Seated Piriformis Stretch with Trunk Bend  - 2-3 x daily - 7 x weekly - 2 sets - 30 sec hold - Standing Hip Hiking  - 2-3 x daily - 7 x weekly - 1 sets - 10  reps - Seated Hip Abduction with Resistance  - 1 x daily - 7 x weekly - 3 sets - 10 reps  ASSESSMENT:  CLINICAL IMPRESSION: Continued to work on improving hip mobility in all directions. R hip tends to come into internal rotation with R knee valgus noticeable during any knee flexion tasks. Session focused on improving motor control patterns to decrease knee valgus in standing/weight bearing. Continued progressive hip strengthening as tolerated  Per Eval Patient is a 79 y.o. F who was seen today for physical therapy evaluation and treatment for R hip pain. Assessment is significant for R>L femoracetabular hypomobility, R>L LE weakness and tenderness and overall increased fall risk based on inability to perform tandem stance or SLS affecting safe home and community mobility. Pt will benefit from PT to address these issues to maximize her level of function.   OBJECTIVE IMPAIRMENTS: Abnormal gait, decreased activity tolerance, decreased balance, decreased coordination, decreased endurance, decreased mobility, decreased ROM, decreased strength, hypomobility, increased fascial restrictions, impaired flexibility, improper body mechanics, postural dysfunction, and pain.   ACTIVITY LIMITATIONS: carrying, lifting, bending, sitting, standing, squatting, stairs, and transfers  PARTICIPATION LIMITATIONS: shopping, community activity, church, and recreational activities  PERSONAL FACTORS: Age, Fitness, Past/current experiences, and Time since onset of injury/illness/exacerbation are also affecting patient's functional outcome.   REHAB POTENTIAL: Good  CLINICAL DECISION MAKING: Evolving/moderate complexity  EVALUATION COMPLEXITY: Moderate   GOALS: Goals reviewed with patient? Yes  SHORT TERM GOALS: Target date: 11/19/2024  Pt will be ind with initial HEP Baseline: Goal status: INITIAL  2.  Pt will report improved sitting tolerance by >/=50% Baseline:  Goal status: INITIAL  3.  Pt will demo  improved tandem stance to at least 20 sec bilat to demo increased balance and standing stability Baseline:  Goal status: INITIAL  4.  Pt will demo L = R hip mobility during FABER testing Baseline:  Goal status: INITIAL   LONG TERM GOALS: Target date: 12/17/2024   Pt will be ind with management and progression of HEP Baseline:  Goal status: INITIAL  2.  Pt will demo increased hip strength to at least 4/5 bilat for standing and postural stability Baseline:  Goal status: INITIAL  3.  Pt will report improved symptoms by >/=75% Baseline:  Goal status: INITIAL  4.  Pt will have improved LEFS to >/=71 to demo MCID Baseline:  Goal status: INITIAL  5.  Pt will be able to stand at least 10 sec during SLS to demo increased single leg stability and strength Baseline:  Goal status: INITIAL    PLAN:  PT FREQUENCY: 2x/week  PT DURATION: 8 weeks  PLANNED INTERVENTIONS: 97164- PT Re-evaluation, 97750- Physical Performance Testing, 97110-Therapeutic exercises, 97530- Therapeutic activity, 97112- Neuromuscular re-education, 97535- Self Care, 02859- Manual therapy, G0283- Electrical stimulation (unattended), Patient/Family education, Balance training, Taping, Joint mobilization, Spinal mobilization, Cryotherapy, and Moist heat  PLAN FOR NEXT SESSION: Assess response to exercise program,  Modalities and manual if indicated. Strengthen and stabilize LEs and core. Keep knee from valgus position and decrease hip IR. Gait training.    Esraa Seres April Ma L Kaelene Elliston, PT, DPT 11/12/2024, 2:33 PM  "

## 2024-11-14 ENCOUNTER — Ambulatory Visit: Admitting: Physical Therapy

## 2024-11-18 ENCOUNTER — Ambulatory Visit: Admitting: Physical Therapy

## 2024-11-25 ENCOUNTER — Ambulatory Visit: Admitting: Physical Therapy

## 2024-11-25 ENCOUNTER — Encounter: Payer: Self-pay | Admitting: Physical Therapy

## 2024-11-25 DIAGNOSIS — M25551 Pain in right hip: Secondary | ICD-10-CM

## 2024-11-25 DIAGNOSIS — M25651 Stiffness of right hip, not elsewhere classified: Secondary | ICD-10-CM

## 2024-11-25 DIAGNOSIS — M6281 Muscle weakness (generalized): Secondary | ICD-10-CM

## 2024-11-25 NOTE — Therapy (Signed)
 " OUTPATIENT PHYSICAL THERAPY TREATMENT   Patient Name: Wendy Barker MRN: [id number] DOB:1946/01/02, 79 y.o., female Today's Date: 11/25/2024  END OF SESSION:  PT End of Session - 11/25/24 1405     Visit Number 4    Number of Visits 16    Date for Recertification  12/17/24    Authorization Type Medicare    PT Start Time 1345    PT Stop Time 1425    PT Time Calculation (min) 40 min    Activity Tolerance Patient tolerated treatment well    Behavior During Therapy WFL for tasks assessed/performed             Past Medical History:  Diagnosis Date   Arthritis    High cholesterol    Hypertension    per notes from [facility]   Torn meniscus    Trigeminal neuralgia    per notes from [facility]    Past Surgical History:  Procedure Laterality Date   CESAREAN SECTION     x2   COLONOSCOPY     per [facility]   FOOT SURGERY     x4   HERNIA REPAIR     x4   MOUTH SURGERY     per [facility]    Patient Active Problem List   Diagnosis Date Noted   Benign essential hypertension 06/12/2019   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   DDD (degenerative disc disease), lumbar 06/12/2019   Depressive disorder 06/12/2019   Insomnia 06/12/2019   Trigeminal neuralgia of left side of face 01/22/2018   Burning tongue syndrome 11/13/2016   Chronic anxiety 11/13/2016   Controlled type 2 diabetes mellitus with complication, with long-term current use of insulin (HCC) 11/13/2016   Dysesthesia 11/13/2016   Sciatica 03/26/2014   Hyperlipidemia, unspecified 02/26/2012    PCP: Maree Isles, MD  REFERRING PROVIDER: Daniel Norleen Barter, PA-C  REFERRING DIAG: M70.61 (ICD-10-CM) - Trochanteric bursitis, right hip  THERAPY DIAG:  Pain in right hip  Stiffness of right hip, not elsewhere classified  Muscle weakness (generalized)  Rationale for Evaluation and Treatment:  Rehabilitation  ONSET DATE: since March 2025  SUBJECTIVE:   SUBJECTIVE STATEMENT: Pt states she has been in pain since last visit, felt like the step up exercise at the 3rd level was too hard. She still feels like she is sitting on a knot or lump.    Per Eval Pt was told she had R hip bursitis. Pt states when she sits it feels like she's sitting on a lump. Didn't see an improvement with the shot. Less pain when she's moving around or standing. Has steps to the basement to the washer/dryer but has been driving around the house to get down there. Has been wearing a slight heel lift on the L. Pt states she went to short term rehab for 17 days and then had HHPT after her hip replacement.   PERTINENT HISTORY: R THA (March 2025), osteoporosis  PAIN:  Are you having pain? Yes: NPRS scale: currently 6 or 7; at worst 8 Pain location: R posterior hip Pain description: like sitting on a lump Aggravating factors: sitting, worsens later in the day Relieving factors: not sitting on it, heat  PRECAUTIONS: None  RED FLAGS: None   WEIGHT BEARING RESTRICTIONS: No  FALLS:  Has patient fallen in last 6 months? No  LIVING ENVIRONMENT: Lives with: lives with their spouse Lives in: House/apartment  Stairs: Level in front Has following equipment at home: None  OCCUPATION: Retired  PLOF: Independent  PATIENT GOALS: Improve hip pain  NEXT MD VISIT: PRN  OBJECTIVE:  Note: Objective measures were completed at Evaluation unless otherwise noted.  DIAGNOSTIC FINDINGS: None seen on Epic  PATIENT SURVEYS:  LEFS  Extreme difficulty/unable (0), Quite a bit of difficulty (1), Moderate difficulty (2), Little difficulty (3), No difficulty (4) Survey date:  10/22/24  Any of your usual work, housework or school activities 3  2. Usual hobbies, recreational or sporting activities 2  3. Getting into/out of the bath 4  4. Walking between rooms 4  5. Putting on socks/shoes 4  6. Squatting  4  7.  Lifting an object, like a bag of groceries from the floor 2  8. Performing light activities around your home 3  9. Performing heavy activities around your home 2  10. Getting into/out of a car 4  11. Walking 2 blocks 3  12. Walking 1 mile 3  13. Going up/down 10 stairs (1 flight) 3  14. Standing for 1 hour 4  15.  sitting for 1 hour 2  16. Running on even ground 2  17. Running on uneven ground 1  18. Making sharp turns while running fast 2  19. Hopping  3  20. Rolling over in bed 4  Score total:  59     COGNITION: Overall cognitive status: Within functional limits for tasks assessed     SENSATION: WFL  EDEMA:  None  MUSCLE LENGTH: Hamstrings: WNL bilat True leg length measurement: To be assessed  POSTURE: R hip drop and lateral shift in standing, mild trunk flexion  PALPATION: TTP posterior femoracetabular joint and into greater trochanter. TTP glute med and piriformis  LOWER EXTREMITY ROM:  Active ROM Right eval Left eval  Hip flexion    Hip extension    Hip abduction    Hip adduction    Hip internal rotation    Hip external rotation    Knee flexion    Knee extension    Ankle dorsiflexion    Ankle plantarflexion    Ankle inversion    Ankle eversion     (Blank rows = not tested)  LOWER EXTREMITY MMT:  MMT Right eval Left eval  Hip flexion 3+ 4  Hip extension 3+ 4-  Hip abduction 3+ 4  Hip adduction    Hip internal rotation    Hip external rotation    Knee flexion 4- 5  Knee extension 3+ 5  Ankle dorsiflexion    Ankle plantarflexion    Ankle inversion    Ankle eversion     (Blank rows = not tested)  LOWER EXTREMITY SPECIAL TESTS:  Hip special tests: Belvie (FABER) test: positive  and Trendelenburg test: positive   FUNCTIONAL TESTS:  L tandem stance: 7.26 sec, R tandem stance: 22.69 sec SLS: <1 sec on R LE: 3 sec on L LE  GAIT: Distance walked: Into clinic Assistive device utilized: None Level of assistance: SBA Comments: Reciprocal  pattern, R hip drop noted during stance  TREATMENT DATE:  11/25/24 Trigger Point Dry Needling Initial Treatment: Pt instructed on Dry Needling rational, procedures, and possible side effects. Pt instructed to expect mild to moderate muscle soreness later in the day and/or into the next day.  Pt instructed in methods to reduce muscle soreness. Pt instructed to continue prescribed HEP. Patient was educated on signs and symptoms of infection and other risk factors and advised to seek medical attention should they occur.  Patient verbalized understanding of these instructions and education.  Patient Verbal Consent Given: Yes Education Handout Provided: Yes Muscles Treated: Right glutes, piriformis Electrical Stimulation Performed: No Treatment Response/Outcome:   good overall tolerance,twitch response noted   Piriformis stretch push knee down 30 sec X 3 on Rt Pirifomis strethc knee to opposite shoulder 30 sec X 3 on Rt Sidelying clams X 15 Sidelying reverse clams X 15 Sidelying hip abduction X 10 reps Supine bridge 3 sec hold X 10 Supine clams green X 15 Supine hip adduction ball squeeze 5 sec X 10       11/12/24 Nustep seat #4, L5 x10 min UEs/LEs Seated figure 4 stretch x 30 Seated piriformis stretch x 30 Seated hip flexor stretch x 30 Seated hip clamshell green TB 3x10 -- cueing to keep toes pointed forward Seated adductor stretch 2x 30 Sit<>stand green TB around thighs 2x10 Fwd lunge stepping on yoga block, pushing knee out into ball on wall to keep from valgus and increase glute med firing 2x10 Standing hip hike on yoga block 2x10 Fwd step up on yoga block x10 Fwd step up on aerobic stepper (level 3) ~8 R LE only 2x10, tactile and verbal cueing to keep from knee valgus    PATIENT EDUCATION:  Education details: Exam findings, POC, initial  HEP Person educated: Patient Education method: Explanation, Demonstration, and Handouts Education comprehension: verbalized understanding, returned demonstration, and needs further education  HOME EXERCISE PROGRAM: Access Code: [email or web address] URL: [email or web address] Date: 11/12/2024 Prepared by: Gellen April Earnie Starring  Exercises - Seated Piriformis Stretch  - 2-3 x daily - 7 x weekly - 2 sets - 30 sec hold - Seated Piriformis Stretch with Trunk Bend  - 2-3 x daily - 7 x weekly - 2 sets - 30 sec hold - Standing Hip Hiking  - 2-3 x daily - 7 x weekly - 1 sets - 10 reps - Seated Hip Abduction with Resistance  - 1 x daily - 7 x weekly - 3 sets - 10 reps  ASSESSMENT:  CLINICAL IMPRESSION: She had some pain and soreness from last visit paticularly the step up exercise so this was held today and instead tried DN intervention followed by NWB exercises for right hip for ROM and gentle activation. We will assess her response to the DN next visit and continue if helpful.  Per Eval Patient is a 79 y.o. F who was seen today for physical therapy evaluation and treatment for R hip pain. Assessment is significant for R>L femoracetabular hypomobility, R>L LE weakness and tenderness and overall increased fall risk based on inability to perform tandem stance or SLS affecting safe home and community mobility. Pt will benefit from PT to address these issues to maximize her level of function.   OBJECTIVE IMPAIRMENTS: Abnormal gait, decreased activity tolerance, decreased balance, decreased coordination, decreased endurance, decreased mobility, decreased ROM, decreased strength, hypomobility, increased fascial restrictions, impaired flexibility, improper body mechanics, postural dysfunction, and pain.   ACTIVITY LIMITATIONS: carrying, lifting, bending, sitting, standing, squatting, stairs, and transfers  PARTICIPATION LIMITATIONS: shopping, community  activity, church, and recreational  activities  PERSONAL FACTORS: Age, Fitness, Past/current experiences, and Time since onset of injury/illness/exacerbation are also affecting patient's functional outcome.   REHAB POTENTIAL: Good  CLINICAL DECISION MAKING: Evolving/moderate complexity  EVALUATION COMPLEXITY: Moderate   GOALS: Goals reviewed with patient? Yes  SHORT TERM GOALS: Target date: 11/19/2024  Pt will be ind with initial HEP Baseline: Goal status: INITIAL  2.  Pt will report improved sitting tolerance by >/=50% Baseline:  Goal status: INITIAL  3.  Pt will demo improved tandem stance to at least 20 sec bilat to demo increased balance and standing stability Baseline:  Goal status: INITIAL  4.  Pt will demo L = R hip mobility during FABER testing Baseline:  Goal status: INITIAL   LONG TERM GOALS: Target date: 12/17/2024   Pt will be ind with management and progression of HEP Baseline:  Goal status: INITIAL  2.  Pt will demo increased hip strength to at least 4/5 bilat for standing and postural stability Baseline:  Goal status: INITIAL  3.  Pt will report improved symptoms by >/=75% Baseline:  Goal status: INITIAL  4.  Pt will have improved LEFS to >/=71 to demo MCID Baseline:  Goal status: INITIAL  5.  Pt will be able to stand at least 10 sec during SLS to demo increased single leg stability and strength Baseline:  Goal status: INITIAL    PLAN:  PT FREQUENCY: 2x/week  PT DURATION: 8 weeks  PLANNED INTERVENTIONS: [id number]- PT Re-evaluation, 97750- Physical Performance Testing, 97110-Therapeutic exercises, 97530- Therapeutic activity, 97112- Neuromuscular re-education, 97535- Self Care, [id number]- Manual therapy, [id number]- Electrical stimulation (unattended), Patient/Family education, Balance training, Taping, Joint mobilization, Spinal mobilization, Cryotherapy, and Moist heat  PLAN FOR NEXT SESSION: We will assess her response to the DN next visit and continue if helpful. Avoid step up on 8  inch.    Redell JONELLE Moose, PT, DPT 11/25/2024, 2:37 PM  "

## 2024-11-25 NOTE — Patient Instructions (Signed)

## 2024-12-03 ENCOUNTER — Ambulatory Visit: Admitting: Physical Therapy

## 2024-12-08 ENCOUNTER — Ambulatory Visit
